# Patient Record
Sex: Female | Born: 1950
Health system: Southern US, Community
[De-identification: ages and names within clinical notes are randomized; demographics above are authoritative.]

## PROBLEM LIST (undated history)

## (undated) DIAGNOSIS — N189 Chronic kidney disease, unspecified: Secondary | ICD-10-CM

## (undated) DIAGNOSIS — F329 Major depressive disorder, single episode, unspecified: Secondary | ICD-10-CM

## (undated) DIAGNOSIS — E119 Type 2 diabetes mellitus without complications: Secondary | ICD-10-CM

## (undated) DIAGNOSIS — F419 Anxiety disorder, unspecified: Secondary | ICD-10-CM

## (undated) DIAGNOSIS — F32A Depression, unspecified: Secondary | ICD-10-CM

## (undated) DIAGNOSIS — E785 Hyperlipidemia, unspecified: Secondary | ICD-10-CM

## (undated) DIAGNOSIS — I10 Essential (primary) hypertension: Secondary | ICD-10-CM

## (undated) DIAGNOSIS — H269 Unspecified cataract: Secondary | ICD-10-CM

## (undated) DIAGNOSIS — K219 Gastro-esophageal reflux disease without esophagitis: Secondary | ICD-10-CM

## (undated) DIAGNOSIS — T7840XA Allergy, unspecified, initial encounter: Secondary | ICD-10-CM

## (undated) DIAGNOSIS — M1990 Unspecified osteoarthritis, unspecified site: Secondary | ICD-10-CM

## (undated) HISTORY — PX: PARTIAL HYSTERECTOMY: SHX80

## (undated) HISTORY — DX: Anxiety disorder, unspecified: F41.9

## (undated) HISTORY — DX: Major depressive disorder, single episode, unspecified: F32.9

## (undated) HISTORY — DX: Chronic kidney disease, unspecified: N18.9

## (undated) HISTORY — PX: KNEE ARTHROSCOPY: SUR90

## (undated) HISTORY — DX: Unspecified cataract: H26.9

## (undated) HISTORY — PX: ESOPHAGEAL DILATION: SHX303

## (undated) HISTORY — PX: SHOULDER ARTHROSCOPY: SHX128

## (undated) HISTORY — DX: Gastro-esophageal reflux disease without esophagitis: K21.9

## (undated) HISTORY — PX: UPPER GASTROINTESTINAL ENDOSCOPY: SHX188

## (undated) HISTORY — DX: Type 2 diabetes mellitus without complications: E11.9

## (undated) HISTORY — PX: POLYPECTOMY: SHX149

## (undated) HISTORY — PX: TUBAL LIGATION: SHX77

## (undated) HISTORY — DX: Allergy, unspecified, initial encounter: T78.40XA

## (undated) HISTORY — PX: HAMMER TOE SURGERY: SHX385

## (undated) HISTORY — DX: Depression, unspecified: F32.A

## (undated) HISTORY — DX: Essential (primary) hypertension: I10

## (undated) HISTORY — PX: TRIGGER FINGER RELEASE: SHX641

## (undated) HISTORY — DX: Unspecified osteoarthritis, unspecified site: M19.90

## (undated) HISTORY — DX: Hyperlipidemia, unspecified: E78.5

---

## 1997-09-16 ENCOUNTER — Ambulatory Visit (HOSPITAL_COMMUNITY): Admission: RE | Admit: 1997-09-16 | Discharge: 1997-09-16 | Payer: Self-pay | Admitting: Internal Medicine

## 2000-12-21 ENCOUNTER — Ambulatory Visit (HOSPITAL_COMMUNITY): Admission: RE | Admit: 2000-12-21 | Discharge: 2000-12-21 | Payer: Self-pay | Admitting: Internal Medicine

## 2000-12-21 ENCOUNTER — Encounter: Payer: Self-pay | Admitting: Internal Medicine

## 2000-12-22 ENCOUNTER — Encounter: Payer: Self-pay | Admitting: Internal Medicine

## 2001-04-24 ENCOUNTER — Other Ambulatory Visit: Admission: RE | Admit: 2001-04-24 | Discharge: 2001-04-24 | Payer: Self-pay | Admitting: Obstetrics and Gynecology

## 2002-07-09 ENCOUNTER — Other Ambulatory Visit: Admission: RE | Admit: 2002-07-09 | Discharge: 2002-07-09 | Payer: Self-pay | Admitting: Obstetrics and Gynecology

## 2004-04-15 ENCOUNTER — Ambulatory Visit: Payer: Self-pay | Admitting: Internal Medicine

## 2004-04-29 ENCOUNTER — Ambulatory Visit: Payer: Self-pay | Admitting: Internal Medicine

## 2004-04-29 HISTORY — PX: COLONOSCOPY: SHX174

## 2004-12-16 ENCOUNTER — Encounter: Admission: RE | Admit: 2004-12-16 | Discharge: 2004-12-16 | Payer: Self-pay | Admitting: Family Medicine

## 2005-05-16 ENCOUNTER — Encounter: Admission: RE | Admit: 2005-05-16 | Discharge: 2005-05-16 | Payer: Self-pay | Admitting: Family Medicine

## 2005-06-23 ENCOUNTER — Encounter: Admission: RE | Admit: 2005-06-23 | Discharge: 2005-06-23 | Payer: Self-pay | Admitting: *Deleted

## 2005-08-07 ENCOUNTER — Emergency Department (HOSPITAL_COMMUNITY): Admission: EM | Admit: 2005-08-07 | Discharge: 2005-08-07 | Payer: Self-pay | Admitting: Emergency Medicine

## 2005-11-12 ENCOUNTER — Observation Stay (HOSPITAL_COMMUNITY): Admission: EM | Admit: 2005-11-12 | Discharge: 2005-11-13 | Payer: Self-pay | Admitting: Emergency Medicine

## 2007-07-22 DIAGNOSIS — I10 Essential (primary) hypertension: Secondary | ICD-10-CM | POA: Insufficient documentation

## 2007-07-22 DIAGNOSIS — R1310 Dysphagia, unspecified: Secondary | ICD-10-CM | POA: Insufficient documentation

## 2007-07-22 DIAGNOSIS — K222 Esophageal obstruction: Secondary | ICD-10-CM

## 2007-07-22 DIAGNOSIS — F411 Generalized anxiety disorder: Secondary | ICD-10-CM | POA: Insufficient documentation

## 2007-07-22 DIAGNOSIS — K219 Gastro-esophageal reflux disease without esophagitis: Secondary | ICD-10-CM

## 2007-07-22 DIAGNOSIS — A048 Other specified bacterial intestinal infections: Secondary | ICD-10-CM | POA: Insufficient documentation

## 2007-07-23 ENCOUNTER — Ambulatory Visit: Payer: Self-pay | Admitting: Internal Medicine

## 2007-07-30 ENCOUNTER — Ambulatory Visit: Payer: Self-pay | Admitting: Internal Medicine

## 2007-07-30 ENCOUNTER — Encounter: Payer: Self-pay | Admitting: Internal Medicine

## 2007-07-30 ENCOUNTER — Telehealth: Payer: Self-pay | Admitting: Internal Medicine

## 2007-07-31 ENCOUNTER — Encounter: Payer: Self-pay | Admitting: Internal Medicine

## 2007-08-08 ENCOUNTER — Encounter: Payer: Self-pay | Admitting: Internal Medicine

## 2008-04-17 ENCOUNTER — Encounter: Payer: Self-pay | Admitting: Internal Medicine

## 2008-10-22 ENCOUNTER — Other Ambulatory Visit: Admission: RE | Admit: 2008-10-22 | Discharge: 2008-10-22 | Payer: Self-pay | Admitting: Family Medicine

## 2008-11-05 ENCOUNTER — Encounter: Admission: RE | Admit: 2008-11-05 | Discharge: 2008-11-05 | Payer: Self-pay | Admitting: Family Medicine

## 2009-02-08 ENCOUNTER — Encounter: Payer: Self-pay | Admitting: Internal Medicine

## 2010-07-08 NOTE — Discharge Summary (Signed)
NAMEJANISA, LABUS              ACCOUNT NO.:  [id number]   MEDICAL RECORD NO.:  [id number]          PATIENT TYPE:  OBV   LOCATION:  4729                         FACILITY:  [facility]   PHYSICIAN:  Corinna L. Lendell Caprice, MDDATE OF BIRTH:  1950-03-20   DATE OF ADMISSION:  11/12/2005  DATE OF DISCHARGE:  11/13/2005                                 DISCHARGE SUMMARY   DISCHARGE DIAGNOSES:  1. Chest pain, myocardial infarction and pulmonary embolus ruled out.  2. Gastroesophageal reflux disease.  3. His hyperlipidemia.  4. Tobacco abuse, counseled against.  5. Previous history of hypertension., blood pressure normal off      antihypertensives.  6. Chronic migraines.   DISCHARGE MEDICATIONS:  1. Prilosec 40 mg a day.  2. She is to STOP Benicar.  3. If her symptoms continue, consider STOPPING Topamax.  4. Continue aspirin 81 mg p.o. daily.   DIET:  Should be low-salt.   ACTIVITY:  Ad lib.   FOLLOWUP:  With Dr. Arvilla Market in 2 weeks.   CONDITION:  Stable.   PROCEDURES:  None.   CONSULTATIONS:  None.   PERTINENT LABS:  CBC unremarkable, D-dimer 0.68.  Complete metabolic panel  unremarkable.  Cardiac enzymes negative.  Special studies in radiology EKG  showed normal sinus rhythm.  Chest x-ray showed nothing acute.  CT angiogram  of the chest showed no PE.  Stress Cardiolite, wet reading, shows no  ischemia and ejection fraction of 84%.   HISTORY AND HOSPITAL COURSE:  Ms. Cerveny is a 60 year old white female  patient of Dr. Cannon Kettle.  Please see H&P for complete admission  details.  She presented with nausea and left-sided chest pain.  She had seen  Dr. Maurice Small in the office and had been scheduled for outpatient  stress test in October.  She returned to the emergency room with a minutes  worth of chest pain.  She had been nauseated for several days.  She had been  on Nexium, in the past, but stopped this on her own.  She also felt some  fluttering of her heart.  She  also felt very panicky before this happened.  She had been started on Benicar and Prozac recently; but stopped both on her  own because she was concerned that they may be contributing to her symptoms.  She had been on antihypertensives but lost about 30 pounds on Topamax; and  since then her blood pressure has decreased.  She has a blood pressure cuff  at home; and off Benicar her blood pressure has been within normal limits.   She had normal vital signs, normal exam, normal EKG, chest x-ray, chest CT;  and was placed on observation to rule out MI.  She remained in normal sinus  rhythm, and had negative serial cardiac enzymes.  Stress Cardiolite was done  and is negative.  I suspect that her pain may be GI related; and I have  given her a prescription for Prilosec.  Her blood pressure has been about  95/50 off Benicar, and I have asked her to stop this.  It is conceivable  that Topamax  can be contributing to some of her symptoms.  If she has no  improvement, I recommended that she call her neurologist to discuss whether  this should be stopped as the other issue is that this could have been panic  attack.  This would not; however, explain five days' worth of nausea.  I  have also given her a prescription for Phenergan as needed 25 mg q.6 h  p.r.n.      Corinna L. Lendell Caprice, MD  Electronically Signed     CLS/MEDQ  D:  11/13/2005  T:  11/15/2005  Job:  [id number]   cc:   Donia Guiles, M.D.

## 2010-07-08 NOTE — Procedures (Signed)
[facility]  Patient:    Autumn Sharp, Autumn Sharp Visit Number: [id number] MRN: [id number]          Service Type: END Location: ENDO Attending Physician:  Mervin Hack Dictated by:   Hedwig Morton. Juanda Chance, M.D. [facility] Admit Date:  12/21/2000   CC:         Desma Maxim, M.D.   Procedure Report  PROCEDURE:  Upper endoscopy with esophageal dilation.  INDICATION:  This 60 year old white female has complained of five months history of difficulty swallowing, mostly with solids.  She has a positive history of H. pylori, which was treated in the past.  She denied any liquid food dysphagia.  She has been on aspirin 325 mg a day.  Prevacid has been increased from 30 mg a day to b.i.d. _____ her last office visit on November 26, 2000.  The patient has been complaining of hoarseness and occasional cough. Upper endoscopy is being done to assess her for esophageal stricture, proceed with dilatation, and recheck her H. pylori test.  ENDOSCOPE:  Olympus single-channel endoscope.  SEDATION:  Versed 7 mg IV, Demerol 50 mg IV.  FINDINGS:  Olympus single-channel endoscope passed under direct vision through the posterior pharynx to the esophagus.  The patient was monitored by pulse oximeter.  Oxygen saturations were normal.  Proximal esophageal mucosa was unremarkable.  The squamocolumnar junction was normal.  There was no stricture.  There was no resistance.  In the distal esophagus, I could not appreciate any hiatal hernia.  Stomach:  Stomach was insufflated with air, revealed normal gastric folds, antrum, and pylorus.  Retroflexion of endoscope revealed normal fundus and cardia.  Duodenum:  Duodenal bulb and descending duodenum were normal.  Endoscope was then brought back through the stomach, a guidewire was placed, endoscope was retracted, and Savary dilator passed under fluoroscopic guidance using 18 mm dilator.  This passed with mild resistance but no blood on  the dilator.  The patient tolerated the procedure well.  IMPRESSION: 1. Passage of 18 mm dilator for solid food dysphagia but no evidence of    stricture. 2. Status post CLOtest.  PLAN: 1. We will observe the patient for the improvement of her symptoms.  There was    no definite stricture found on endoscopy, although the patient had solid    food dysphagia.  She is to continue the Prevacid 30 mg p.o. b.i.d. for two    more weeks, then one p.o. q.d. 2. Decrease aspirin from 325 mg to 81 mg a day. 3. Continue antireflux measures. 4. Await results of the CLOtest. Dictated by:   Hedwig Morton. Juanda Chance, M.D. [facility] Attending Physician:  Mervin Hack DD:  12/21/00 TD:  12/22/00 Job: [id number]

## 2010-07-08 NOTE — H&P (Signed)
Autumn Sharp, Autumn Sharp              ACCOUNT NO.:  [id number]   MEDICAL RECORD NO.:  [id number]          PATIENT TYPE:  OBV   LOCATION:  4729                         FACILITY:  [facility]   PHYSICIAN:  Corinna L. Lendell Caprice, MDDATE OF BIRTH:  December 13, 1950   DATE OF ADMISSION:  11/12/2005  DATE OF DISCHARGE:  11/13/2005                                HISTORY & PHYSICAL   CHIEF COMPLAINT:  Chest pain and nausea.   HISTORY OF PRESENT ILLNESS:  Autumn Sharp is a pleasant 60 year old white  female patient of Dr. Arvilla Market and who has also seen one of the [facility]  cardiologists in the past who presents with chest pain.  It is on the left  side of her chest.  It was accompanied by nausea.  She had palpitations a  few days ago and was seen in the office by Dr. Maurice Small, who  apparently did an EKG and the patient reports it was abnormal.  She  scheduled a stress test with one of the cardiologists later next month, but  the patient had several minutes' worth of chest pain today and therefore  presented to the emergency room.  She has been under a lot of stress  recently and she felt panicky earlier this morning.  She has had problems  with nausea and thought it may be the Benicar which was started recently.  She is also on Topamax and has lost a lot of weight recently.  She also used  to take Nexium for acid reflux but it is not covered by her health plan and  she no longer takes it.  She has no chest pain now.  She has a difficult  time qualifying it further.  She had a few fleeting episodes of left chest  pain yesterday.  She reports having had an echocardiogram and a stress test  about 4 years ago for apparently what sounds like a heart murmur on exam.  She also started Prozac earlier this week but only took 4 days' worth.   PAST MEDICAL HISTORY:  1. Hypertension.  2. Hyperlipidemia.  3. Tobacco abuse.  4. Migraines.   MEDICATIONS:  1. Topamax 100 mg a day.  2. Benicar 40 mg a day, which  she has not taken for 5 days.  3. Aspirin 81 mg a day.  4. Zocor 40 mg a day.  5. She took a few days of Prozac but stopped this a few days ago.   She is allergic to [facility].   SOCIAL HISTORY:  She is married.  She smokes a half a pack of cigarettes a  day.  She denies alcohol abuse or drug abuse.  She works for [employer].   FAMILY HISTORY:  Her father had a heart attack in his early 50s.   REVIEW OF SYSTEMS:  As above, otherwise negative.   PHYSICAL EXAMINATION:  VITAL SIGNS:  Her temperature is 98.4, blood pressure  132/66, pulse 63, respiratory rate 10, oxygen saturation 97% on room air.  GENERAL:  The patient is a somewhat anxious-appearing white female.  HEENT:  Normocephalic, atraumatic.  Pupils equal, round,  reactive to light.  Sclerae nonicteric.  Moist mucous membranes.  NECK:  Supple.  LUNGS:  Clear to auscultation bilaterally without wheezes, rhonchi or rales.  CARDIOVASCULAR:  Regular rate and rhythm without murmurs, gallops or rubs.  There is no chest wall tenderness.  ABDOMEN:  Soft, nontender, nondistended.  GENITOURINARY AND RECTAL:  Deferred.  EXTREMITIES:  No clubbing, cyanosis or edema.  SKIN:  No rash.  PSYCHIATRIC:  Anxious affect.  NEUROLOGIC:  Alert and oriented.  Cranial nerves and sensorimotor exam are  intact.   LABORATORIES:  CBC, BMET, cardiac enzymes negative.  EKG shows normal sinus  rhythm.  Chest x-ray negative.  CT of the chest:  No PE.   ASSESSMENT AND PLAN:  1. Chest pain.  Somewhat atypical, although she does have cardiac risk      factors.  I will place her on observation and discuss with cardiology      getting a stress test in-house.  I will continue her aspirin and hold      her Benicar and Topamax.  2. Nausea.  This sounds suspicious for medication related, possibly the      Topamax.  She seems to think it is the Benicar.  Either way, her blood      pressure is 97/50 and I am going to hold blood pressure medications.       She reports having lost 30 pounds recently and may be able to come off      antihypertensives.  3. Hyperlipidemia.  4. Tobacco abuse, counseled against.  5. Chronic migraines.  See above.      Corinna L. Lendell Caprice, MD  Electronically Signed     CLS/MEDQ  D:  11/12/2005  T:  11/14/2005  Job:  [id number]   cc:   Francisca December, M.D.  Donia Guiles, M.D.

## 2010-12-01 ENCOUNTER — Other Ambulatory Visit: Payer: Self-pay | Admitting: Family Medicine

## 2010-12-01 ENCOUNTER — Other Ambulatory Visit (HOSPITAL_COMMUNITY)
Admission: RE | Admit: 2010-12-01 | Discharge: 2010-12-01 | Disposition: A | Discharge status: Home / Self Care | Payer: 59 | Source: Ambulatory Visit | Attending: Family Medicine | Admitting: Family Medicine

## 2010-12-01 DIAGNOSIS — Z124 Encounter for screening for malignant neoplasm of cervix: Secondary | ICD-10-CM | POA: Insufficient documentation

## 2014-03-28 ENCOUNTER — Encounter: Payer: Self-pay | Admitting: Internal Medicine

## 2014-07-17 ENCOUNTER — Encounter: Payer: Self-pay | Admitting: Internal Medicine

## 2014-07-21 ENCOUNTER — Other Ambulatory Visit: Payer: Self-pay | Admitting: Family Medicine

## 2014-07-21 ENCOUNTER — Other Ambulatory Visit (HOSPITAL_COMMUNITY)
Admission: RE | Admit: 2014-07-21 | Discharge: 2014-07-21 | Disposition: A | Discharge status: Home / Self Care | Payer: 59 | Source: Ambulatory Visit | Attending: Family Medicine | Admitting: Family Medicine

## 2014-07-21 DIAGNOSIS — Z124 Encounter for screening for malignant neoplasm of cervix: Secondary | ICD-10-CM | POA: Insufficient documentation

## 2014-07-22 LAB — CYTOLOGY - PAP

## 2014-08-28 ENCOUNTER — Encounter: Payer: Self-pay | Admitting: Internal Medicine

## 2014-09-25 ENCOUNTER — Encounter: Payer: Self-pay | Admitting: Gastroenterology

## 2014-10-16 ENCOUNTER — Encounter: Payer: Self-pay | Admitting: Internal Medicine

## 2014-11-24 ENCOUNTER — Ambulatory Visit (AMBULATORY_SURGERY_CENTER): Payer: Self-pay

## 2014-11-24 VITALS — Ht 64.0 in | Wt 175.8 lb

## 2014-11-24 DIAGNOSIS — Z800 Family history of malignant neoplasm of digestive organs: Secondary | ICD-10-CM

## 2014-11-24 MED ORDER — NA SULFATE-K SULFATE-MG SULF 17.5-3.13-1.6 GM/177ML PO SOLN: ORAL | Status: DC

## 2014-11-24 NOTE — Progress Notes (Signed)
Per pt, no allergies to soy or egg products.Pt not taking any weight loss meds or using  O2 at home. 

## 2014-11-30 ENCOUNTER — Telehealth: Payer: Self-pay | Admitting: Gastroenterology

## 2014-11-30 MED ORDER — NA SULFATE-K SULFATE-MG SULF 17.5-3.13-1.6 GM/177ML PO SOLN: 1.0000 | Freq: Once | ORAL | Status: DC

## 2014-11-30 NOTE — Telephone Encounter (Signed)
Autumn Sharp placed phone call to patient. Prep 95.00 without insurance and 85.00 with insurance. Too expensive for patient. Sample placed at front desk for patient to pick up.

## 2014-11-30 NOTE — Telephone Encounter (Signed)
Advised pt we can give her a sample and she can pick it up at the desk on the 4th floor.

## 2014-12-04 ENCOUNTER — Telehealth: Payer: Self-pay | Admitting: Gastroenterology

## 2014-12-04 ENCOUNTER — Encounter: Payer: Self-pay | Admitting: *Deleted

## 2014-12-04 NOTE — Telephone Encounter (Signed)
Spoke with patient and she is having back pain that is radiating down her left leg. She wants to reschedule her procedure. Moved her to 02/01/15. New instructions mailed to patient.

## 2014-12-08 ENCOUNTER — Encounter: Payer: Self-pay | Admitting: Gastroenterology

## 2015-01-28 ENCOUNTER — Telehealth: Payer: Self-pay | Admitting: Gastroenterology

## 2015-01-28 NOTE — Telephone Encounter (Signed)
No that's ok, she called with advance notice.   Autumn Sharp, can you please try to fill this slot with one of the patient's on our waiting list, or if the PAs have an urgent case to get done? Thanks

## 2015-01-28 NOTE — Telephone Encounter (Signed)
Yes, would you like me to also fill the slots on the 14th as well?

## 2015-01-28 NOTE — Telephone Encounter (Signed)
Amber can you please discuss my schedule with Remo Lipps who know the details. I will be in touch with Remo Lipps further about this tomorrow. Thanks

## 2015-02-01 ENCOUNTER — Encounter: Payer: Self-pay | Admitting: Gastroenterology

## 2015-03-22 ENCOUNTER — Ambulatory Visit (AMBULATORY_SURGERY_CENTER): Payer: Self-pay | Admitting: *Deleted

## 2015-03-22 VITALS — Ht 64.0 in | Wt 174.0 lb

## 2015-03-22 DIAGNOSIS — Z1211 Encounter for screening for malignant neoplasm of colon: Secondary | ICD-10-CM

## 2015-03-22 NOTE — Progress Notes (Signed)
No egg or soy allergy known to patient  No issues with past sedation with any surgeries  or procedures, no intubation problems  No diet pills per patient No home 02 use per patient  No blood thinners per patient  Pt has a suprep from a PV 11-2014 so another not ordered  emmi declined

## 2015-04-05 ENCOUNTER — Encounter: Payer: Self-pay | Admitting: Gastroenterology

## 2015-04-05 ENCOUNTER — Ambulatory Visit (AMBULATORY_SURGERY_CENTER): Payer: 59 | Admitting: Gastroenterology

## 2015-04-05 VITALS — BP 124/68 | HR 69 | Temp 97.7°F | Resp 20 | Ht 64.0 in | Wt 174.0 lb

## 2015-04-05 DIAGNOSIS — D123 Benign neoplasm of transverse colon: Secondary | ICD-10-CM

## 2015-04-05 DIAGNOSIS — D120 Benign neoplasm of cecum: Secondary | ICD-10-CM | POA: Diagnosis not present

## 2015-04-05 DIAGNOSIS — D122 Benign neoplasm of ascending colon: Secondary | ICD-10-CM

## 2015-04-05 DIAGNOSIS — Z1211 Encounter for screening for malignant neoplasm of colon: Secondary | ICD-10-CM

## 2015-04-05 MED ORDER — SODIUM CHLORIDE 0.9 % IV SOLN: 500.0000 mL | INTRAVENOUS | Status: DC

## 2015-04-05 NOTE — Op Note (Signed)
[place]  [address] [place], [address]   COLONOSCOPY PROCEDURE REPORT  PATIENT: Autumn, Sharp  MR#: [id number] BIRTHDATE: [birth year] , 55  yrs. old GENDER: female ENDOSCOPIST: Yetta Flock, MD REFERRED BY: Kelton Pillar MD PROCEDURE DATE:  04/05/2015 PROCEDURE:   Colonoscopy, screening, Colonoscopy with snare polypectomy, and Colonoscopy with biopsy First Screening Colonoscopy - Avg.  risk and is 50 yrs.  old or older - No.  Prior Negative Screening - Now for repeat screening. N/A  History of Adenoma - Now for follow-up colonoscopy & has been > or = to 3 yrs.  N/A  Polyps removed today? Yes ASA CLASS:   Class II INDICATIONS:Screening for colonic neoplasia and Colorectal Neoplasm Risk Assessment for this procedure is average risk. MEDICATIONS: Propofol 550 mg IV  DESCRIPTION OF PROCEDURE:   After the risks benefits and alternatives of the procedure were thoroughly explained, informed consent was obtained.  The digital rectal exam revealed no abnormalities of the rectum.   The LB [id number]-[id number]  endoscope was introduced through the anus and advanced to the cecum, which was identified by both the appendix and ileocecal valve. No adverse events experienced.   The quality of the prep was adequate  The instrument was then slowly withdrawn as the colon was fully examined. Estimated blood loss is zero unless otherwise noted in this procedure report.      COLON FINDINGS: A diminutive flat polyp was noted in the cecum and removed with cold forceps.  Two flat polyps roughly 5-50mm in size were noted in the ascending colon and removed via cold snare.  Five flat polyps were noted in the transverse colon, roughly 4-7 mm in size and removed with cold snare.  One sessile polyps roughly 4-29mm was noted in the transverse colon and removed with cold snare.  A 66mm sessile polyp was noted in the transverse colon and removed with cold forceps.  Mild to  moderate diverticulosis was noted in the left colon.  Of note, a few of these transverse polyps were removed during intubation.  Several of the flat polyps removed on this exam were difficult to grasp with the soft snare, but had difficulty cutting through with cold technique when grasped with the stiff snare, leading to prolonged procedure time.  However, all polyps were removed successfully without the use of cautery. Retroflexed views revealed internal hemorrhoids. The time to cecum = 14.2 Withdrawal time = 31.4   The scope was withdrawn and the procedure completed. COMPLICATIONS: There were no immediate complications.  ENDOSCOPIC IMPRESSION: 10 polyps removed as outlined above Diverticulosis Internal hemorrhoids  RECOMMENDATIONS: Await pathology results No NSAIDS for 2 weeks Resume diet Resume medications  eSigned:  Yetta Flock, MD 04/05/2015 4:13 PM   cc:  Kelton Pillar MD, the patient   PATIENT NAME:  Autumn Sharp, Autumn Sharp MR#: [id number]

## 2015-04-05 NOTE — Progress Notes (Signed)
  [facility] Post-op Note  Patient: Autumn Sharp  Procedure(s) Performed: colonoscopy  Patient Location: [facility] - Recovery Area  Anesthesia Type: Deep Sedation/Propofol  Level of Consciousness: awake, oriented and patient cooperative  Airway and Oxygen Therapy: Patient Spontanous Breathing  Post-op Pain: none  Post-op Assessment:  Post-op Vital signs reviewed, Patient's Cardiovascular Status Stable, Respiratory Function Stable, Patent Airway, No signs of Nausea or vomiting and Pain level controlled  Post-op Vital Signs: Reviewed and stable  Complications: No apparent anesthesia complications  Ruthella Kirchman E 4:12 PM

## 2015-04-05 NOTE — Patient Instructions (Signed)
YOU HAD AN ENDOSCOPIC PROCEDURE TODAY AT [facility]:   Refer to the procedure report that was given to you for any specific questions about what was found during the examination.  If the procedure report does not answer your questions, please call your gastroenterologist to clarify.  If you requested that your care partner not be given the details of your procedure findings, then the procedure report has been included in a sealed envelope for you to review at your convenience later.  YOU SHOULD EXPECT: Some feelings of bloating in the abdomen. Passage of more gas than usual.  Walking can help get rid of the air that was put into your GI tract during the procedure and reduce the bloating. If you had a lower endoscopy (such as a colonoscopy or flexible sigmoidoscopy) you may notice spotting of blood in your stool or on the toilet paper. If you underwent a bowel prep for your procedure, you may not have a normal bowel movement for a few days.  Please Note:  You might notice some irritation and congestion in your nose or some drainage.  This is from the oxygen used during your procedure.  There is no need for concern and it should clear up in a day or so.  SYMPTOMS TO REPORT IMMEDIATELY:   Following lower endoscopy (colonoscopy or flexible sigmoidoscopy):  Excessive amounts of blood in the stool  Significant tenderness or worsening of abdominal pains  Swelling of the abdomen that is new, acute  Fever of 100F or higher   For urgent or emergent issues, a gastroenterologist can be reached at any hour by calling [phone].   DIET: Your first meal following the procedure should be a small meal and then it is ok to progress to your normal diet. Heavy or fried foods are harder to digest and may make you feel nauseous or bloated.  Likewise, meals heavy in dairy and vegetables can increase bloating.  Drink plenty of fluids but you should avoid alcoholic beverages for 24  hours.  ACTIVITY:  You should plan to take it easy for the rest of today and you should NOT DRIVE or use heavy machinery until tomorrow (because of the sedation medicines used during the test).    FOLLOW UP: Our staff will call the number listed on your records the next business day following your procedure to check on you and address any questions or concerns that you may have regarding the information given to you following your procedure. If we do not reach you, we will leave a message.  However, if you are feeling well and you are not experiencing any problems, there is no need to return our call.  We will assume that you have returned to your regular daily activities without incident.  If any biopsies were taken you will be contacted by phone or by letter within the next 1-3 weeks.  Please call us at [phone] if you have not heard about the biopsies in 3 weeks.    SIGNATURES/CONFIDENTIALITY: You and/or your care partner have signed paperwork which will be entered into your electronic medical record.  These signatures attest to the fact that that the information above on your After Visit Summary has been reviewed and is understood.  Full responsibility of the confidentiality of this discharge information lies with you and/or your care-partner.  Polyps, diverticulosis, hemorrhoids-handouts given  No NSAIDs (ibuprofen, motrin, advil, aleve, aspirin, etc) for 2 weeks.  Repeat colonoscopy will be determined by pathology.

## 2015-04-05 NOTE — Progress Notes (Signed)
Called to room to assist during endoscopic procedure.  Patient ID and intended procedure confirmed with present staff. Received instructions for my participation in the procedure from the performing physician.  

## 2015-04-06 ENCOUNTER — Telehealth: Payer: Self-pay

## 2015-04-06 NOTE — Telephone Encounter (Signed)
  Follow up Call-  Call back number 04/05/2015  Post procedure Call Back phone  # [phone] cell  Permission to leave phone message Yes     Patient questions:  Do you have a fever, pain , or abdominal swelling? No. Pain Score  0 *  Have you tolerated food without any problems? Yes.    Have you been able to return to your normal activities? Yes.    Do you have any questions about your discharge instructions: Diet   No. Medications  No. Follow up visit  No.  Do you have questions or concerns about your Care? No.  Actions: * If pain score is 4 or above: No action needed, pain <4.

## 2015-04-09 ENCOUNTER — Encounter: Payer: Self-pay | Admitting: Gastroenterology

## 2015-05-03 ENCOUNTER — Encounter: Payer: Self-pay | Admitting: Internal Medicine

## 2016-03-06 ENCOUNTER — Encounter: Payer: Self-pay | Admitting: Gastroenterology

## 2016-04-19 ENCOUNTER — Encounter: Payer: Self-pay | Admitting: Gastroenterology

## 2016-05-26 ENCOUNTER — Ambulatory Visit (AMBULATORY_SURGERY_CENTER): Payer: Self-pay

## 2016-05-26 ENCOUNTER — Encounter: Payer: Self-pay | Admitting: Gastroenterology

## 2016-05-26 VITALS — Ht 63.75 in | Wt 172.4 lb

## 2016-05-26 DIAGNOSIS — Z860100 Personal history of colon polyps, unspecified: Secondary | ICD-10-CM

## 2016-05-26 MED ORDER — SUPREP BOWEL PREP KIT 17.5-3.13-1.6 GM/177ML PO SOLN: 1.0000 | Freq: Once | ORAL | 0 refills | Status: AC

## 2016-05-26 NOTE — Progress Notes (Signed)
No allergies to eggs or soy No diet meds nohome oxygen No past problems with anesthesia except did have issues with Ether many years ago  Registered for emmi

## 2016-06-09 ENCOUNTER — Ambulatory Visit (AMBULATORY_SURGERY_CENTER): Payer: 59 | Admitting: Gastroenterology

## 2016-06-09 ENCOUNTER — Encounter: Payer: Self-pay | Admitting: Gastroenterology

## 2016-06-09 VITALS — BP 93/66 | HR 60 | Temp 98.0°F | Resp 17 | Ht 63.0 in | Wt 172.0 lb

## 2016-06-09 DIAGNOSIS — D123 Benign neoplasm of transverse colon: Secondary | ICD-10-CM

## 2016-06-09 DIAGNOSIS — Z86010 Personal history of colonic polyps: Secondary | ICD-10-CM

## 2016-06-09 DIAGNOSIS — D120 Benign neoplasm of cecum: Secondary | ICD-10-CM | POA: Diagnosis not present

## 2016-06-09 MED ORDER — SODIUM CHLORIDE 0.9 % IV SOLN: 500.0000 mL | INTRAVENOUS | Status: DC

## 2016-06-09 NOTE — Progress Notes (Signed)
o recovery, report to [employer], Therapist, sports, VSS

## 2016-06-09 NOTE — Progress Notes (Signed)
Called to room to assist during endoscopic procedure.  Patient ID and intended procedure confirmed with present staff. Received instructions for my participation in the procedure from the performing physician.  

## 2016-06-09 NOTE — Op Note (Signed)
[place] Patient Name: Autumn Sharp Procedure Date: 06/09/2016 2:36 PM MRN: [id number] Endoscopist: Remo Lipps P. Bonne Whack MD, MD Age: 66 Referring MD:  Date of Birth: 1950/03/31 Gender: Female Account #: [id number] Procedure:                Colonoscopy Indications:              Surveillance: History of numerous (10) adenomas on                            last colonoscopy 1 year ago Medicines:                Monitored Anesthesia Care Procedure:                Pre-Anesthesia Assessment:                           - Prior to the procedure, a History and Physical                            was performed, and patient medications and                            allergies were reviewed. The patient's tolerance of                            previous anesthesia was also reviewed. The risks                            and benefits of the procedure and the sedation                            options and risks were discussed with the patient.                            All questions were answered, and informed consent                            was obtained. Prior Anticoagulants: The patient has                            taken no previous anticoagulant or antiplatelet                            agents. ASA Grade Assessment: II - A patient with                            mild systemic disease. After reviewing the risks                            and benefits, the patient was deemed in                            satisfactory condition to undergo the procedure.  After obtaining informed consent, the colonoscope                            was passed under direct vision. Throughout the                            procedure, the patient's blood pressure, pulse, and                            oxygen saturations were monitored continuously. The                            Colonoscope was introduced through the anus and                            advanced to the the  cecum, identified by                            appendiceal orifice and ileocecal valve. The                            colonoscopy was performed without difficulty. The                            patient tolerated the procedure well. The quality                            of the bowel preparation was adequate. The                            ileocecal valve, appendiceal orifice, and rectum                            were photographed. Scope In: 0:26:37 PM Scope Out: 3:09:10 PM Scope Withdrawal Time: 0 hours 18 minutes 43 seconds  Total Procedure Duration: 0 hours 22 minutes 51 seconds  Findings:                 The perianal and digital rectal examinations were                            normal.                           Two flat polyps were found in the cecum. The polyps                            were 3 to 6 mm in size. These polyps were removed                            with a cold snare. Resection and retrieval were                            complete.  A 5 mm polyp was found in the transverse colon. The                            polyp was flat. The polyp was removed with a cold                            snare. Resection and retrieval were complete.                           A few medium-mouthed diverticula were found in the                            sigmoid colon.                           The colon was tortuous.                           Internal hemorrhoids were found during retroflexion.                           The exam was otherwise without abnormality. Complications:            No immediate complications. Estimated blood loss:                            Minimal. Estimated Blood Loss:     Estimated blood loss was minimal. Impression:               - Two 3 to 6 mm polyps in the cecum, removed with a                            cold snare. Resected and retrieved.                           - One 5 mm polyp in the transverse colon, removed                             with a cold snare. Resected and retrieved.                           - Diverticulosis in the sigmoid colon.                           - Tortuous colon.                           - Internal hemorrhoids.                           - The examination was otherwise normal. Recommendation:           - Patient has a contact number available for                            emergencies. The signs and symptoms  of potential                            delayed complications were discussed with the                            patient. Return to normal activities tomorrow.                            Written discharge instructions were provided to the                            patient.                           - Resume previous diet.                           - Continue present medications.                           - Await pathology results.                           - Repeat colonoscopy is recommended for                            surveillance in 3 years                           - No ibuprofen, naproxen, or other non-steroidal                            anti-inflammatory drugs for 2 weeks after polyp                            removal. Remo Lipps P. Braydyn Schultes MD, MD 06/09/2016 3:13:16 PM This report has been signed electronically.

## 2016-06-09 NOTE — Progress Notes (Signed)
Pt's states no medical or surgical changes since previsit or office visit. 

## 2016-06-09 NOTE — Patient Instructions (Signed)
YOU HAD AN ENDOSCOPIC PROCEDURE TODAY AT [facility]:   Refer to the procedure report that was given to you for any specific questions about what was found during the examination.  If the procedure report does not answer your questions, please call your gastroenterologist to clarify.  If you requested that your care partner not be given the details of your procedure findings, then the procedure report has been included in a sealed envelope for you to review at your convenience later.  YOU SHOULD EXPECT: Some feelings of bloating in the abdomen. Passage of more gas than usual.  Walking can help get rid of the air that was put into your GI tract during the procedure and reduce the bloating. If you had a lower endoscopy (such as a colonoscopy or flexible sigmoidoscopy) you may notice spotting of blood in your stool or on the toilet paper. If you underwent a bowel prep for your procedure, you may not have a normal bowel movement for a few days.  Please Note:  You might notice some irritation and congestion in your nose or some drainage.  This is from the oxygen used during your procedure.  There is no need for concern and it should clear up in a day or so.  SYMPTOMS TO REPORT IMMEDIATELY:   Following lower endoscopy (colonoscopy or flexible sigmoidoscopy):  Excessive amounts of blood in the stool  Significant tenderness or worsening of abdominal pains  Swelling of the abdomen that is new, acute  Fever of 100F or higher   For urgent or emergent issues, a gastroenterologist can be reached at any hour by calling [phone].   DIET:  We do recommend a small meal at first, but then you may proceed to your regular diet.  Drink plenty of fluids but you should avoid alcoholic beverages for 24 hours.  ACTIVITY:  You should plan to take it easy for the rest of today and you should NOT DRIVE or use heavy machinery until tomorrow (because of the sedation medicines used during the test).     FOLLOW UP: Our staff will call the number listed on your records the next business day following your procedure to check on you and address any questions or concerns that you may have regarding the information given to you following your procedure. If we do not reach you, we will leave a message.  However, if you are feeling well and you are not experiencing any problems, there is no need to return our call.  We will assume that you have returned to your regular daily activities without incident.  If any biopsies were taken you will be contacted by phone or by letter within the next 1-3 weeks.  Please call us at [phone] if you have not heard about the biopsies in 3 weeks.    SIGNATURES/CONFIDENTIALITY: You and/or your care partner have signed paperwork which will be entered into your electronic medical record.  These signatures attest to the fact that that the information above on your After Visit Summary has been reviewed and is understood.  Full responsibility of the confidentiality of this discharge information lies with you and/or your care-partner.  Polyp, diverticulosis, high fiber diet information.given.  Hemorrhoid information given.

## 2016-06-12 ENCOUNTER — Telehealth: Payer: Self-pay | Admitting: *Deleted

## 2016-06-12 NOTE — Telephone Encounter (Signed)
  Follow up Call-  Call back number 06/09/2016 04/05/2015  Post procedure Call Back phone  # [phone] cell  Permission to leave phone message Yes Yes  Some recent data might be hidden     Patient questions:  Do you have a fever, pain , or abdominal swelling? No. Pain Score  0 *  Have you tolerated food without any problems? Yes.    Have you been able to return to your normal activities? Yes.    Do you have any questions about your discharge instructions: Diet   No. Medications  No. Follow up visit  No.  Do you have questions or concerns about your Care? No.  Actions: * If pain score is 4 or above: No action needed, pain <4.

## 2016-06-13 ENCOUNTER — Encounter: Payer: Self-pay | Admitting: Gastroenterology

## 2016-08-29 ENCOUNTER — Other Ambulatory Visit: Payer: Self-pay | Admitting: Family Medicine

## 2016-08-29 ENCOUNTER — Ambulatory Visit
Admission: RE | Admit: 2016-08-29 | Discharge: 2016-08-29 | Disposition: A | Discharge status: Home / Self Care | Payer: 59 | Source: Ambulatory Visit | Attending: Family Medicine | Admitting: Family Medicine

## 2016-08-29 DIAGNOSIS — R05 Cough: Secondary | ICD-10-CM

## 2016-08-29 DIAGNOSIS — R059 Cough, unspecified: Secondary | ICD-10-CM

## 2018-10-11 ENCOUNTER — Emergency Department: Payer: 59

## 2018-10-11 ENCOUNTER — Encounter: Payer: Self-pay | Admitting: Emergency Medicine

## 2018-10-11 ENCOUNTER — Other Ambulatory Visit: Payer: Self-pay

## 2018-10-11 ENCOUNTER — Emergency Department
Admission: EM | Admit: 2018-10-11 | Discharge: 2018-10-11 | Disposition: A | Discharge status: Home / Self Care | Payer: 59 | Attending: Emergency Medicine | Admitting: Emergency Medicine

## 2018-10-11 DIAGNOSIS — Z79899 Other long term (current) drug therapy: Secondary | ICD-10-CM | POA: Diagnosis not present

## 2018-10-11 DIAGNOSIS — N23 Unspecified renal colic: Secondary | ICD-10-CM

## 2018-10-11 DIAGNOSIS — R1031 Right lower quadrant pain: Secondary | ICD-10-CM | POA: Diagnosis present

## 2018-10-11 DIAGNOSIS — N132 Hydronephrosis with renal and ureteral calculous obstruction: Secondary | ICD-10-CM | POA: Diagnosis not present

## 2018-10-11 DIAGNOSIS — I10 Essential (primary) hypertension: Secondary | ICD-10-CM | POA: Insufficient documentation

## 2018-10-11 LAB — LIPASE, BLOOD: Lipase: 72 U/L — ABNORMAL HIGH (ref 11–51)

## 2018-10-11 LAB — URINALYSIS, COMPLETE (UACMP) WITH MICROSCOPIC
Bacteria, UA: NONE SEEN
Bilirubin Urine: NEGATIVE
Glucose, UA: NEGATIVE mg/dL
Ketones, ur: NEGATIVE mg/dL
Leukocytes,Ua: NEGATIVE
Nitrite: NEGATIVE
Protein, ur: NEGATIVE mg/dL
Specific Gravity, Urine: 1.021 (ref 1.005–1.030)
pH: 6 (ref 5.0–8.0)

## 2018-10-11 LAB — COMPREHENSIVE METABOLIC PANEL
ALT: 21 U/L (ref 0–44)
AST: 21 U/L (ref 15–41)
Albumin: 4.4 g/dL (ref 3.5–5.0)
Alkaline Phosphatase: 57 U/L (ref 38–126)
Anion gap: 7 (ref 5–15)
BUN: 13 mg/dL (ref 8–23)
CO2: 28 mmol/L (ref 22–32)
Calcium: 9.8 mg/dL (ref 8.9–10.3)
Chloride: 102 mmol/L (ref 98–111)
Creatinine, Ser: 0.98 mg/dL (ref 0.44–1.00)
GFR calc Af Amer: >60 mL/min (ref >60)
GFR calc non Af Amer: >60 mL/min (ref >60)
Glucose, Bld: 131 mg/dL — ABNORMAL HIGH (ref 70–99)
Potassium: 3.5 mmol/L (ref 3.5–5.1)
Sodium: 137 mmol/L (ref 135–145)
Total Bilirubin: 0.7 mg/dL (ref 0.3–1.2)
Total Protein: 7.4 g/dL (ref 6.5–8.1)

## 2018-10-11 LAB — CBC
HCT: 42.9 % (ref 36.0–46.0)
Hemoglobin: 13.9 g/dL (ref 12.0–15.0)
MCH: 30.6 pg (ref 26.0–34.0)
MCHC: 32.4 g/dL (ref 30.0–36.0)
MCV: 94.5 fL (ref 80.0–100.0)
Platelets: 283 10*3/uL (ref 150–400)
RBC: 4.54 MIL/uL (ref 3.87–5.11)
RDW: 12.5 % (ref 11.5–15.5)
WBC: 12.5 10*3/uL — ABNORMAL HIGH (ref 4.0–10.5)
nRBC: 0 % (ref 0.0–0.2)

## 2018-10-11 MED ORDER — IOHEXOL 240 MG/ML SOLN
50.0000 mL | Freq: Once | INTRAMUSCULAR | Status: AC
  Administered 2018-10-11: 50 mL via ORAL

## 2018-10-11 MED ORDER — MORPHINE SULFATE (PF) 4 MG/ML IV SOLN
4.0000 mg | Freq: Once | INTRAVENOUS | Status: AC
  Administered 2018-10-11: 15:00:00 4 mg via INTRAVENOUS
  Filled 2018-10-11: qty 1

## 2018-10-11 MED ORDER — IOHEXOL 300 MG/ML  SOLN
100.0000 mL | Freq: Once | INTRAMUSCULAR | Status: AC | PRN
  Administered 2018-10-11: 17:00:00 100 mL via INTRAVENOUS

## 2018-10-11 MED ORDER — KETOROLAC TROMETHAMINE 30 MG/ML IJ SOLN
INTRAMUSCULAR | Status: AC
  Administered 2018-10-11: 18:00:00 30 mg via INTRAVENOUS
  Filled 2018-10-11: qty 1

## 2018-10-11 MED ORDER — FENTANYL CITRATE (PF) 100 MCG/2ML IJ SOLN
50.0000 ug | INTRAMUSCULAR | Status: DC | PRN
  Administered 2018-10-11: 50 ug via INTRAVENOUS
  Filled 2018-10-11: qty 2

## 2018-10-11 MED ORDER — OXYCODONE-ACETAMINOPHEN 5-325 MG PO TABS: 1.0000 | ORAL_TABLET | ORAL | 0 refills | Status: AC | PRN

## 2018-10-11 MED ORDER — TAMSULOSIN HCL 0.4 MG PO CAPS: 0.4000 mg | ORAL_CAPSULE | Freq: Every day | ORAL | 0 refills | Status: AC

## 2018-10-11 MED ORDER — ONDANSETRON HCL 4 MG/2ML IJ SOLN
4.0000 mg | Freq: Once | INTRAMUSCULAR | Status: AC | PRN
  Administered 2018-10-11: 4 mg via INTRAVENOUS
  Filled 2018-10-11: qty 2

## 2018-10-11 MED ORDER — ONDANSETRON HCL 4 MG/2ML IJ SOLN
4.0000 mg | Freq: Once | INTRAMUSCULAR | Status: AC
  Administered 2018-10-11: 16:00:00 4 mg via INTRAVENOUS
  Filled 2018-10-11: qty 2

## 2018-10-11 MED ORDER — KETOROLAC TROMETHAMINE 30 MG/ML IJ SOLN
30.0000 mg | Freq: Once | INTRAMUSCULAR | Status: AC
  Administered 2018-10-11: 18:00:00 30 mg via INTRAVENOUS

## 2018-10-11 MED ORDER — SODIUM CHLORIDE 0.9 % IV BOLUS
1000.0000 mL | Freq: Once | INTRAVENOUS | Status: AC
  Administered 2018-10-11: 17:00:00 1000 mL via INTRAVENOUS

## 2018-10-11 NOTE — ED Triage Notes (Signed)
PT arrives with complaints of RLQ pain that started at 1100 today. PT increased with palpation. Pt also report nausea and vomiting.

## 2018-10-11 NOTE — Discharge Instructions (Addendum)
You have a kidney stone.  It small enough that it should pass.  To help it pass will have you take the Flomax 1 pill a day.  Sometimes it will make you little woozy please be careful.  I also can give you some Percocet for pain.  Take 1 pill 4 times a day as needed for pain.  Please strain all your urine and try and catch the stone.  Take the stone to the urologist.  Dr. Bernardo Heater is on-call he is very good.  Please give his office a call Monday morning let him know that she had a 4 mm kidney stone he should be out of follow-up with you in about a week or so.  Please return here for worse pain fever vomiting or feeling sicker.

## 2018-10-11 NOTE — ED Notes (Signed)
Patient transported to CT 

## 2018-10-11 NOTE — ED Provider Notes (Signed)
[facility] Emergency Department Provider Note   ____________________________________________   First MD Initiated Contact with Patient 10/11/18 1514     (approximate)  I have reviewed the triage vital signs and the nursing notes.   HISTORY  Chief Complaint Abdominal Pain   HPI Autumn Sharp is a 68 y.o. female who reports right lower quadrant starting at 11:00 today.  Pain is worse with palpation and percussion.  Patient also had some nausea and vomiting.  She reports a couple days ago she had some blood in her urine but no pain.  Now she has the pain but no blood in the urine and no dysuria.  Pain is moderate.  Helped a lot by the morphine and Zofran in the waiting room.  Is kind of deep and achy pain.  Constant.         Past Medical History:  Diagnosis Date  . Allergy    seasonal  . Anxiety   . Depression   . GERD (gastroesophageal reflux disease)   . Hyperlipidemia   . Hypertension     Patient Active Problem List   Diagnosis Date Noted  . HELICOBACTER PYLORI INFECTION 07/22/2007  . ANXIETY 07/22/2007  . HYPERTENSION 07/22/2007  . ESOPHAGEAL STRICTURE 07/22/2007  . GERD 07/22/2007  . DYSPHAGIA UNSPECIFIED 07/22/2007    Past Surgical History:  Procedure Laterality Date  . COLONOSCOPY  04-29-2004   DB-normal  . ESOPHAGEAL DILATION    . HAMMER TOE SURGERY     bil little toes  . KNEE ARTHROSCOPY     left knee  . PARTIAL HYSTERECTOMY    . SHOULDER ARTHROSCOPY     right  . TRIGGER FINGER RELEASE     right hand  . TUBAL LIGATION    . UPPER GASTROINTESTINAL ENDOSCOPY      Prior to Admission medications   Medication Sig Start Date End Date Taking? Authorizing Provider  aspirin 81 MG tablet Take 81 mg by mouth daily.    [provider]  calcium-vitamin D (OSCAL WITH D) 500-200 MG-UNIT tablet Take 1 tablet by mouth daily with breakfast.    [provider]  FLUoxetine (PROZAC) 20 MG capsule Take 20 mg by mouth  daily.    [provider]  fluticasone (FLONASE) 50 MCG/ACT nasal spray Place 2 sprays into both nostrils daily. Reported on 04/05/2015    [provider]  loratadine (ALLERGY RELIEF) 10 MG tablet Take 10 mg by mouth daily.    [provider]  losartan-hydrochlorothiazide (HYZAAR) 100-12.5 MG tablet Take 1 tablet by mouth daily.    [provider]  oxyCODONE-acetaminophen (PERCOCET) 5-325 MG tablet Take 1 tablet by mouth every 4 (four) hours as needed for severe pain. 10/11/18 10/11/19  Nena Polio, MD  ranitidine (ZANTAC) 150 MG capsule Take 150 mg by mouth daily.    [provider]  simvastatin (ZOCOR) 40 MG tablet Take 40 mg by mouth daily.    [provider]  tamsulosin (FLOMAX) 0.4 MG CAPS capsule Take 1 capsule (0.4 mg total) by mouth daily. 10/11/18   Nena Polio, MD  TURMERIC PO Take 500 mg by mouth daily. Reported on 03/22/2015    [provider]  vitamin B-12 (CYANOCOBALAMIN) 100 MCG tablet Take 100 mcg by mouth daily.    [provider]    Allergies Atenolol and Shellfish allergy  Family History  Problem Relation Age of Onset  . Colon cancer Maternal Aunt   . Liver disease Brother   .  Colon polyps Neg Hx   . Esophageal cancer Neg Hx   . Rectal cancer Neg Hx   . Stomach cancer Neg Hx   . Pancreatic cancer Neg Hx     Social History Social History   Tobacco Use  . Smoking status: Never Smoker  . Smokeless tobacco: Never Used  Substance Use Topics  . Alcohol use: No    Alcohol/week: 0.0 standard drinks  . Drug use: No    Review of Systems  Constitutional: No fever/chills Eyes: No visual changes. ENT: No sore throat. Cardiovascular: Denies chest pain. Respiratory: Denies shortness of breath. Gastrointestinal: Right lower quadrant abdominal pain.   nausea,  vomiting.  No diarrhea.  No constipation. Genitourinary: Negative for dysuria. Musculoskeletal: Negative for back pain. Skin: Negative  for rash. Neurological: Negative for headaches, focal weakness   ____________________________________________   PHYSICAL EXAM:  VITAL SIGNS: ED Triage Vitals  Enc Vitals Group     BP 10/11/18 1337 137/62     Pulse Rate 10/11/18 1337 (!) 59     Resp 10/11/18 1337 20     Temp 10/11/18 1337 98.5 F (36.9 C)     Temp Source 10/11/18 1337 Oral     SpO2 10/11/18 1337 96 %     Weight 10/11/18 1335 156 lb (70.8 kg)     Height 10/11/18 1335 5\' 4"  (1.626 m)     Head Circumference --      Peak Flow --      Pain Score 10/11/18 1336 8     Pain Loc --      Pain Edu? --      Excl. in [place]? --     Constitutional: Alert and oriented. Well appearing and in no acute distress. Eyes: Conjunctivae are normal.  Head: Atraumatic. Nose: No congestion/rhinnorhea. Mouth/Throat: Mucous membranes are moist.  Oropharynx non-erythematous. Neck: No stridor.   Cardiovascular: Normal rate, regular rhythm. Grossly normal heart sounds.  Good peripheral circulation. Respiratory: Normal respiratory effort.  No retractions. Lungs CTAB. Gastrointestinal: Soft and nontender in the right lower quadrant where is tender to palpation and slightly to percussion. No distention. No abdominal bruits. No CVA tenderness. Musculoskeletal: No lower extremity tenderness nor edema.   Neurologic:  Normal speech and language. No gross focal neurologic deficits are appreciated.  Skin:  Skin is warm, dry and intact. No rash noted.   ____________________________________________   LABS (all labs ordered are listed, but only abnormal results are displayed)  Labs Reviewed  LIPASE, BLOOD - Abnormal; Notable for the following components:      Result Value   Lipase 72 (*)    All other components within normal limits  COMPREHENSIVE METABOLIC PANEL - Abnormal; Notable for the following components:   Glucose, Bld 131 (*)    All other components within normal limits  CBC - Abnormal; Notable for the following components:   WBC 12.5  (*)    All other components within normal limits  URINALYSIS, COMPLETE (UACMP) WITH MICROSCOPIC - Abnormal; Notable for the following components:   Color, Urine STRAW (*)    APPearance CLEAR (*)    Hgb urine dipstick LARGE (*)    All other components within normal limits   ____________________________________________  EKG   ____________________________________________  RADIOLOGY  ED MD interpretation:   Official radiology report(s): Ct Abdomen Pelvis W Contrast  Result Date: 10/11/2018 CLINICAL DATA:  RIGHT lower quadrant pain.  Nausea and vomiting EXAM: CT ABDOMEN AND PELVIS WITH CONTRAST TECHNIQUE: Multidetector CT imaging of the abdomen  and pelvis was performed using the standard protocol following bolus administration of intravenous contrast. CONTRAST:  162mL OMNIPAQUE IOHEXOL 300 MG/ML  SOLN COMPARISON:  None. FINDINGS: Lower chest: Lung bases are clear. Hepatobiliary: No focal hepatic lesion. No biliary duct dilatation. Gallbladder is normal. Common bile duct is normal. Pancreas: Pancreas is normal. No ductal dilatation. No pancreatic inflammation. Spleen: Normal spleen Adrenals/urinary tract: Adrenal glands are normal. Hydronephrosis and hydroureter of the RIGHT collecting system secondary to obstructing calculus in the distal RIGHT ureter measuring 4 mm on image 81/2. This distal RIGHT ureteral calculus is approximately 1 cm from the vesicoureteral junction. There is inflammation of the distal ureter associated obstructing stone. No LEFT nephrolithiasis or ureterolithiasis. No bladder calculi. Nonenhancing cyst exophytic from the LEFT kidney is small at 1 cm. Stomach/Bowel: Stomach, small bowel, appendix, and cecum are normal. The colon and rectosigmoid colon are normal. Vascular/Lymphatic: Abdominal aorta is normal caliber. No periportal or retroperitoneal adenopathy. No pelvic adenopathy. Reproductive: Post hysterectomy Other: No free fluid. Musculoskeletal: No aggressive osseous  lesion. IMPRESSION: 1. High-grade obstruction of the RIGHT ureter with a calculus 1 cm from the RIGHT vascular junction. 2. Minimal excretion of contrast in the collecting system on the RIGHT on delayed imaging. Electronically Signed   By: Suzy Bouchard M.D.   On: 10/11/2018 17:16    ____________________________________________   PROCEDURES  Procedure(s) performed (including Critical Care):  Procedures   ____________________________________________   INITIAL IMPRESSION / ASSESSMENT AND PLAN / ED COURSE  DESHANDRA KOELLING was evaluated in Emergency Department on 10/11/2018 for the symptoms described in the history of present illness. She was evaluated in the context of the global COVID-19 pandemic, which necessitated consideration that the patient might be at risk for infection with the SARS-CoV-2 virus that causes COVID-19. Institutional protocols and algorithms that pertain to the evaluation of patients at risk for COVID-19 are in a state of rapid change based on information released by regulatory bodies including the CDC and federal and state organizations. These policies and algorithms were followed during the patient's care in the ED.    Patient with renal colic I will give her some Flomax and Percocet she will follow-up with Dr. Priscella Mann off return for any further problems.      Clinical Course as of Oct 11 1930  Fri Oct 11, 2018  1515 MCV: 94.5 [PM]    Clinical Course User Index [PM] Nena Polio, MD     ____________________________________________   FINAL CLINICAL IMPRESSION(S) / ED DIAGNOSES  Final diagnoses:  Renal colic     ED Discharge Orders         Ordered    oxyCODONE-acetaminophen (PERCOCET) 5-325 MG tablet  Every 4 hours PRN     10/11/18 1840    tamsulosin (FLOMAX) 0.4 MG CAPS capsule  Daily     10/11/18 1840           Note:  This document was prepared using Dragon voice recognition software and may include unintentional dictation errors.     Nena Polio, MD 10/11/18 [phone]

## 2019-04-08 DIAGNOSIS — R69 Illness, unspecified: Secondary | ICD-10-CM | POA: Diagnosis not present

## 2019-04-16 DIAGNOSIS — R69 Illness, unspecified: Secondary | ICD-10-CM | POA: Diagnosis not present

## 2019-05-28 DIAGNOSIS — R7303 Prediabetes: Secondary | ICD-10-CM | POA: Diagnosis not present

## 2019-05-28 DIAGNOSIS — E782 Mixed hyperlipidemia: Secondary | ICD-10-CM | POA: Diagnosis not present

## 2019-05-28 DIAGNOSIS — Z7189 Other specified counseling: Secondary | ICD-10-CM | POA: Diagnosis not present

## 2019-05-28 DIAGNOSIS — I10 Essential (primary) hypertension: Secondary | ICD-10-CM | POA: Diagnosis not present

## 2019-06-17 DIAGNOSIS — R69 Illness, unspecified: Secondary | ICD-10-CM | POA: Diagnosis not present

## 2019-07-11 DIAGNOSIS — R69 Illness, unspecified: Secondary | ICD-10-CM | POA: Diagnosis not present

## 2019-07-29 ENCOUNTER — Encounter: Payer: Self-pay | Admitting: Gastroenterology

## 2019-07-31 DIAGNOSIS — Z1231 Encounter for screening mammogram for malignant neoplasm of breast: Secondary | ICD-10-CM | POA: Diagnosis not present

## 2019-08-03 DIAGNOSIS — R69 Illness, unspecified: Secondary | ICD-10-CM | POA: Diagnosis not present

## 2019-08-04 DIAGNOSIS — R69 Illness, unspecified: Secondary | ICD-10-CM | POA: Diagnosis not present

## 2019-08-14 DIAGNOSIS — E119 Type 2 diabetes mellitus without complications: Secondary | ICD-10-CM | POA: Diagnosis not present

## 2019-08-21 HISTORY — PX: COLONOSCOPY W/ POLYPECTOMY: SHX1380

## 2019-08-27 DIAGNOSIS — R69 Illness, unspecified: Secondary | ICD-10-CM | POA: Diagnosis not present

## 2019-09-04 ENCOUNTER — Ambulatory Visit (AMBULATORY_SURGERY_CENTER): Payer: Self-pay | Admitting: *Deleted

## 2019-09-04 ENCOUNTER — Other Ambulatory Visit: Payer: Self-pay

## 2019-09-04 VITALS — Ht 64.0 in | Wt 161.0 lb

## 2019-09-04 DIAGNOSIS — Z01818 Encounter for other preprocedural examination: Secondary | ICD-10-CM

## 2019-09-04 DIAGNOSIS — Z86010 Personal history of colonic polyps: Secondary | ICD-10-CM

## 2019-09-04 MED ORDER — SUTAB 1479-225-188 MG PO TABS: 24.0000 | ORAL_TABLET | ORAL | 0 refills | Status: DC

## 2019-09-04 NOTE — Progress Notes (Signed)
No egg or soy allergy known to patient  No issues with past sedation with any surgeries or procedures no intubation problems in the past  No diet pills per patient No home 02 use per patient  No blood thinners per patient  Pt denies issues with constipation  No A fib or A flutter  EMMI video to pt or MyChart  COVID 19 guidelines implemented in [facility] today   sutab  Coupon given to pt in [facility] today   Due to the COVID-19 pandemic we are asking patients to follow these guidelines. Please only bring one care partner. Please be aware that your care partner may wait in the car in the parking lot or if they feel like they will be too hot to wait in the car, they may wait in the lobby on the 4th floor. All care partners are required to wear a mask the entire time (we do not have any that we can provide them), they need to practice social distancing, and we will do a Covid check for all patient's and care partners when you arrive. Also we will check their temperature and your temperature. If the care partner waits in their car they need to stay in the parking lot the entire time and we will call them on their cell phone when the patient is ready for discharge so they can bring the car to the front of the building. Also all patient's will need to wear a mask into building.s[place]ab

## 2019-09-05 ENCOUNTER — Encounter: Payer: Self-pay | Admitting: Gastroenterology

## 2019-09-16 ENCOUNTER — Other Ambulatory Visit: Payer: Self-pay

## 2019-09-16 ENCOUNTER — Other Ambulatory Visit
Admission: RE | Admit: 2019-09-16 | Discharge: 2019-09-16 | Disposition: A | Discharge status: Home / Self Care | Payer: Medicare HMO | Source: Ambulatory Visit | Attending: Gastroenterology | Admitting: Gastroenterology

## 2019-09-16 DIAGNOSIS — Z01812 Encounter for preprocedural laboratory examination: Secondary | ICD-10-CM | POA: Insufficient documentation

## 2019-09-16 DIAGNOSIS — Z20822 Contact with and (suspected) exposure to covid-19: Secondary | ICD-10-CM | POA: Insufficient documentation

## 2019-09-16 LAB — SARS CORONAVIRUS 2 (TAT 6-24 HRS): SARS Coronavirus 2: NEGATIVE

## 2019-09-18 ENCOUNTER — Other Ambulatory Visit: Payer: Self-pay

## 2019-09-18 ENCOUNTER — Ambulatory Visit (AMBULATORY_SURGERY_CENTER): Payer: Medicare HMO | Admitting: Gastroenterology

## 2019-09-18 ENCOUNTER — Encounter: Payer: Self-pay | Admitting: Gastroenterology

## 2019-09-18 VITALS — BP 108/66 | HR 56 | Temp 97.1°F | Resp 16 | Ht 64.0 in | Wt 161.0 lb

## 2019-09-18 DIAGNOSIS — D120 Benign neoplasm of cecum: Secondary | ICD-10-CM

## 2019-09-18 DIAGNOSIS — D123 Benign neoplasm of transverse colon: Secondary | ICD-10-CM

## 2019-09-18 DIAGNOSIS — Z86010 Personal history of colonic polyps: Secondary | ICD-10-CM | POA: Diagnosis not present

## 2019-09-18 DIAGNOSIS — Z1211 Encounter for screening for malignant neoplasm of colon: Secondary | ICD-10-CM | POA: Diagnosis not present

## 2019-09-18 MED ORDER — SODIUM CHLORIDE 0.9 % IV SOLN: 500.0000 mL | Freq: Once | INTRAVENOUS | Status: DC

## 2019-09-18 NOTE — Op Note (Signed)
Autumn Sharp: Autumn Sharp Procedure Date: 09/18/2019 8:32 AM MRN: [id number] Endoscopist: Remo Lipps P. Havery Moros , MD Age: 69 Referring MD:  Date of Birth: 01-17-1951 Gender: Female Account #: [id number] Procedure:                Colonoscopy Indications:              High risk colon cancer surveillance: Personal                            history of colonic polyps (2017 - 10 adenomas, 2008                            - 3 polyps) Medicines:                Monitored Anesthesia Care Procedure:                Pre-Anesthesia Assessment:                           - Prior to the procedure, a History and Physical                            was performed, and patient medications and                            allergies were reviewed. The patient's tolerance of                            previous anesthesia was also reviewed. The risks                            and benefits of the procedure and the sedation                            options and risks were discussed with the patient.                            All questions were answered, and informed consent                            was obtained. Prior Anticoagulants: The patient has                            taken no previous anticoagulant or antiplatelet                            agents. ASA Grade Assessment: II - A patient with                            mild systemic disease. After reviewing the risks                            and benefits, the patient was deemed in  satisfactory condition to undergo the procedure.                           After obtaining informed consent, the colonoscope                            was passed under direct vision. Throughout the                            procedure, the patient's blood pressure, pulse, and                            oxygen saturations were monitored continuously. The                            Colonoscope was introduced through the anus  and                            advanced to the the cecum, identified by                            appendiceal orifice and ileocecal valve. The                            colonoscopy was performed without difficulty. The                            patient tolerated the procedure well. The quality                            of the bowel preparation was good. The ileocecal                            valve, appendiceal orifice, and rectum were                            photographed. Scope In: 8:36:49 AM Scope Out: 9:00:16 AM Scope Withdrawal Time: 0 hours 18 minutes 40 seconds  Total Procedure Duration: 0 hours 23 minutes 27 seconds  Findings:                 The perianal and digital rectal examinations were                            normal.                           Two sessile polyps were found in the cecum. The                            polyps were 3 mm in size. These polyps were removed                            with a cold snare. Resection and retrieval were  complete.                           A diminutive polyp was found in the hepatic                            flexure. The polyp was sessile. The polyp was                            removed with a cold snare. Resection and retrieval                            were complete.                           A 4 mm polyp was found in the transverse colon. The                            polyp was sessile. The polyp was removed with a                            cold snare. Resection and retrieval were complete.                           A few small-mouthed diverticula were found in the                            sigmoid colon.                           Internal hemorrhoids were found during                            retroflexion. The hemorrhoids were small.                           The exam was otherwise without abnormality. Complications:            No immediate complications. Estimated blood loss:                             Minimal. Estimated Blood Loss:     Estimated blood loss was minimal. Impression:               - Two 3 mm polyps in the cecum, removed with a cold                            snare. Resected and retrieved.                           - One diminutive polyp at the hepatic flexure,                            removed with a cold snare. Resected and retrieved.                           -  One 4 mm polyp in the transverse colon, removed                            with a cold snare. Resected and retrieved.                           - Diverticulosis in the sigmoid colon.                           - Internal hemorrhoids.                           - The examination was otherwise normal. Recommendation:           - Patient has a contact number available for                            emergencies. The signs and symptoms of potential                            delayed complications were discussed with the                            patient. Return to normal activities tomorrow.                            Written discharge instructions were provided to the                            patient.                           - Resume previous diet.                           - Continue present medications.                           - Await pathology results. Remo Lipps P. Faith Branan, MD 09/18/2019 9:05:01 AM This report has been signed electronically.

## 2019-09-18 NOTE — Patient Instructions (Signed)
HANDOUTS PROVIDED ON: POLYPS, DIVERTICULOSIS, & HEMORRHOIDS  The polyps removed today have been sent for pathology.  The results can take 1-3 weeks to receive.  When your next colonoscopy should occur will be based on the pathology results.    You may resume your previous diet and medication schedule.  Thank you for allowing us to care for you today!!!   YOU HAD AN ENDOSCOPIC PROCEDURE TODAY AT THE [facility]:   Refer to the procedure report that was given to you for any specific questions about what was found during the examination.  If the procedure report does not answer your questions, please call your gastroenterologist to clarify.  If you requested that your care partner not be given the details of your procedure findings, then the procedure report has been included in a sealed envelope for you to review at your convenience later.  YOU SHOULD EXPECT: Some feelings of bloating in the abdomen. Passage of more gas than usual.  Walking can help get rid of the air that was put into your GI tract during the procedure and reduce the bloating. If you had a lower endoscopy (such as a colonoscopy or flexible sigmoidoscopy) you may notice spotting of blood in your stool or on the toilet paper. If you underwent a bowel prep for your procedure, you may not have a normal bowel movement for a few days.  Please Note:  You might notice some irritation and congestion in your nose or some drainage.  This is from the oxygen used during your procedure.  There is no need for concern and it should clear up in a day or so.  SYMPTOMS TO REPORT IMMEDIATELY:   Following lower endoscopy (colonoscopy or flexible sigmoidoscopy):  Excessive amounts of blood in the stool  Significant tenderness or worsening of abdominal pains  Swelling of the abdomen that is new, acute  Fever of 100F or higher  For urgent or emergent issues, a gastroenterologist can be reached at any hour by calling [phone]. Do  not use MyChart messaging for urgent concerns.    DIET:  We do recommend a small meal at first, but then you may proceed to your regular diet.  Drink plenty of fluids but you should avoid alcoholic beverages for 24 hours.  ACTIVITY:  You should plan to take it easy for the rest of today and you should NOT DRIVE or use heavy machinery until tomorrow (because of the sedation medicines used during the test).    FOLLOW UP: Our staff will call the number listed on your records 48-72 hours following your procedure to check on you and address any questions or concerns that you may have regarding the information given to you following your procedure. If we do not reach you, we will leave a message.  We will attempt to reach you two times.  During this call, we will ask if you have developed any symptoms of COVID 19. If you develop any symptoms (ie: fever, flu-like symptoms, shortness of breath, cough etc.) before then, please call [phone].  If you test positive for Covid 19 in the 2 weeks post procedure, please call and report this information to us.    If any biopsies were taken you will be contacted by phone or by letter within the next 1-3 weeks.  Please call us at [phone] if you have not heard about the biopsies in 3 weeks.    SIGNATURES/CONFIDENTIALITY: You and/or your care partner have signed paperwork which will   be entered into your electronic medical record.  These signatures attest to the fact that that the information above on your After Visit Summary has been reviewed and is understood.  Full responsibility of the confidentiality of this discharge information lies with you and/or your care-partner. 

## 2019-09-18 NOTE — Progress Notes (Signed)
Pt's states no medical or surgical changes since previsit or office visit. 

## 2019-09-18 NOTE — Progress Notes (Signed)
Called to room to assist during endoscopic procedure.  Patient ID and intended procedure confirmed with present staff. Received instructions for my participation in the procedure from the performing physician.  

## 2019-09-22 ENCOUNTER — Telehealth: Payer: Self-pay | Admitting: *Deleted

## 2019-09-22 DIAGNOSIS — R69 Illness, unspecified: Secondary | ICD-10-CM | POA: Diagnosis not present

## 2019-09-22 NOTE — Telephone Encounter (Signed)
Message left

## 2019-09-22 NOTE — Telephone Encounter (Signed)
Attempted f/u phone call. No answer. Left message. °

## 2019-10-16 DIAGNOSIS — R69 Illness, unspecified: Secondary | ICD-10-CM | POA: Diagnosis not present

## 2019-10-28 DIAGNOSIS — H269 Unspecified cataract: Secondary | ICD-10-CM | POA: Diagnosis not present

## 2019-10-28 DIAGNOSIS — R69 Illness, unspecified: Secondary | ICD-10-CM | POA: Diagnosis not present

## 2019-10-28 DIAGNOSIS — J309 Allergic rhinitis, unspecified: Secondary | ICD-10-CM | POA: Diagnosis not present

## 2019-10-28 DIAGNOSIS — Z008 Encounter for other general examination: Secondary | ICD-10-CM | POA: Diagnosis not present

## 2019-10-28 DIAGNOSIS — R32 Unspecified urinary incontinence: Secondary | ICD-10-CM | POA: Diagnosis not present

## 2019-10-28 DIAGNOSIS — E785 Hyperlipidemia, unspecified: Secondary | ICD-10-CM | POA: Diagnosis not present

## 2019-10-28 DIAGNOSIS — Z809 Family history of malignant neoplasm, unspecified: Secondary | ICD-10-CM | POA: Diagnosis not present

## 2019-10-28 DIAGNOSIS — I10 Essential (primary) hypertension: Secondary | ICD-10-CM | POA: Diagnosis not present

## 2019-10-28 DIAGNOSIS — F329 Major depressive disorder, single episode, unspecified: Secondary | ICD-10-CM | POA: Diagnosis not present

## 2019-10-28 DIAGNOSIS — Z7982 Long term (current) use of aspirin: Secondary | ICD-10-CM | POA: Diagnosis not present

## 2019-10-28 DIAGNOSIS — F419 Anxiety disorder, unspecified: Secondary | ICD-10-CM | POA: Diagnosis not present

## 2019-11-08 DIAGNOSIS — R69 Illness, unspecified: Secondary | ICD-10-CM | POA: Diagnosis not present

## 2019-12-02 DIAGNOSIS — R69 Illness, unspecified: Secondary | ICD-10-CM | POA: Diagnosis not present

## 2019-12-04 DIAGNOSIS — R69 Illness, unspecified: Secondary | ICD-10-CM | POA: Diagnosis not present

## 2019-12-31 DIAGNOSIS — R69 Illness, unspecified: Secondary | ICD-10-CM | POA: Diagnosis not present

## 2019-12-31 DIAGNOSIS — J309 Allergic rhinitis, unspecified: Secondary | ICD-10-CM | POA: Diagnosis not present

## 2019-12-31 DIAGNOSIS — M8588 Other specified disorders of bone density and structure, other site: Secondary | ICD-10-CM | POA: Diagnosis not present

## 2019-12-31 DIAGNOSIS — Z1389 Encounter for screening for other disorder: Secondary | ICD-10-CM | POA: Diagnosis not present

## 2019-12-31 DIAGNOSIS — Z0000 Encounter for general adult medical examination without abnormal findings: Secondary | ICD-10-CM | POA: Diagnosis not present

## 2019-12-31 DIAGNOSIS — R7303 Prediabetes: Secondary | ICD-10-CM | POA: Diagnosis not present

## 2019-12-31 DIAGNOSIS — I10 Essential (primary) hypertension: Secondary | ICD-10-CM | POA: Diagnosis not present

## 2019-12-31 DIAGNOSIS — K219 Gastro-esophageal reflux disease without esophagitis: Secondary | ICD-10-CM | POA: Diagnosis not present

## 2019-12-31 DIAGNOSIS — E782 Mixed hyperlipidemia: Secondary | ICD-10-CM | POA: Diagnosis not present

## 2019-12-31 DIAGNOSIS — I8390 Asymptomatic varicose veins of unspecified lower extremity: Secondary | ICD-10-CM | POA: Diagnosis not present

## 2020-06-29 DIAGNOSIS — R7303 Prediabetes: Secondary | ICD-10-CM | POA: Diagnosis not present

## 2020-06-29 DIAGNOSIS — R079 Chest pain, unspecified: Secondary | ICD-10-CM | POA: Diagnosis not present

## 2020-06-29 DIAGNOSIS — E782 Mixed hyperlipidemia: Secondary | ICD-10-CM | POA: Diagnosis not present

## 2020-06-29 DIAGNOSIS — I10 Essential (primary) hypertension: Secondary | ICD-10-CM | POA: Diagnosis not present

## 2020-07-22 DIAGNOSIS — E119 Type 2 diabetes mellitus without complications: Secondary | ICD-10-CM | POA: Diagnosis not present

## 2020-07-22 DIAGNOSIS — H2513 Age-related nuclear cataract, bilateral: Secondary | ICD-10-CM | POA: Diagnosis not present

## 2020-08-25 DIAGNOSIS — H2512 Age-related nuclear cataract, left eye: Secondary | ICD-10-CM | POA: Diagnosis not present

## 2020-08-26 ENCOUNTER — Encounter: Payer: Self-pay | Admitting: Ophthalmology

## 2020-08-26 ENCOUNTER — Other Ambulatory Visit: Payer: Self-pay

## 2020-09-07 NOTE — Discharge Instructions (Signed)

## 2020-09-08 ENCOUNTER — Encounter
Admission: RE | Disposition: A | Discharge status: Home / Self Care | Payer: Self-pay | Source: Home / Self Care | Attending: Ophthalmology

## 2020-09-08 ENCOUNTER — Ambulatory Visit: Payer: Medicare HMO | Admitting: Anesthesiology

## 2020-09-08 ENCOUNTER — Other Ambulatory Visit: Payer: Self-pay

## 2020-09-08 ENCOUNTER — Ambulatory Visit
Admission: RE | Admit: 2020-09-08 | Discharge: 2020-09-08 | Disposition: A | Discharge status: Home / Self Care | Payer: Medicare HMO | Attending: Ophthalmology | Admitting: Ophthalmology

## 2020-09-08 DIAGNOSIS — Z91013 Allergy to seafood: Secondary | ICD-10-CM | POA: Insufficient documentation

## 2020-09-08 DIAGNOSIS — N189 Chronic kidney disease, unspecified: Secondary | ICD-10-CM | POA: Diagnosis not present

## 2020-09-08 DIAGNOSIS — Z888 Allergy status to other drugs, medicaments and biological substances status: Secondary | ICD-10-CM | POA: Diagnosis not present

## 2020-09-08 DIAGNOSIS — H25812 Combined forms of age-related cataract, left eye: Secondary | ICD-10-CM | POA: Diagnosis not present

## 2020-09-08 DIAGNOSIS — R7303 Prediabetes: Secondary | ICD-10-CM | POA: Diagnosis not present

## 2020-09-08 DIAGNOSIS — Z79899 Other long term (current) drug therapy: Secondary | ICD-10-CM | POA: Insufficient documentation

## 2020-09-08 DIAGNOSIS — I129 Hypertensive chronic kidney disease with stage 1 through stage 4 chronic kidney disease, or unspecified chronic kidney disease: Secondary | ICD-10-CM | POA: Insufficient documentation

## 2020-09-08 DIAGNOSIS — Z7982 Long term (current) use of aspirin: Secondary | ICD-10-CM | POA: Insufficient documentation

## 2020-09-08 DIAGNOSIS — H2512 Age-related nuclear cataract, left eye: Secondary | ICD-10-CM | POA: Diagnosis not present

## 2020-09-08 HISTORY — PX: CATARACT EXTRACTION W/PHACO: SHX586

## 2020-09-08 SURGERY — PHACOEMULSIFICATION, CATARACT, WITH IOL INSERTION
Anesthesia: Monitor Anesthesia Care | Site: Eye | Laterality: Left

## 2020-09-08 MED ORDER — CEFUROXIME OPHTHALMIC INJECTION 1 MG/0.1 ML
INJECTION | OPHTHALMIC | Status: DC | PRN
  Administered 2020-09-08: 0.1 mL via INTRACAMERAL

## 2020-09-08 MED ORDER — SIGHTPATH DOSE#1 BSS IO SOLN
INTRAOCULAR | Status: DC | PRN
  Administered 2020-09-08: 74 mL via OPHTHALMIC

## 2020-09-08 MED ORDER — SIGHTPATH DOSE#1 NA HYALUR & NA CHOND-NA HYALUR IO KIT
PACK | INTRAOCULAR | Status: DC | PRN
  Administered 2020-09-08: 1 via OPHTHALMIC

## 2020-09-08 MED ORDER — BRIMONIDINE TARTRATE-TIMOLOL 0.2-0.5 % OP SOLN
OPHTHALMIC | Status: DC | PRN
  Administered 2020-09-08: 1 [drp] via OPHTHALMIC

## 2020-09-08 MED ORDER — PHENYLEPHRINE HCL 10 % OP SOLN
1.0000 [drp] | OPHTHALMIC | Status: AC
  Administered 2020-09-08 (×3): 1 [drp] via OPHTHALMIC

## 2020-09-08 MED ORDER — LACTATED RINGERS IV SOLN: INTRAVENOUS | Status: DC

## 2020-09-08 MED ORDER — SIGHTPATH DOSE#1 BSS IO SOLN
INTRAOCULAR | Status: DC | PRN
  Administered 2020-09-08: 1 mL

## 2020-09-08 MED ORDER — TETRACAINE HCL 0.5 % OP SOLN
1.0000 [drp] | OPHTHALMIC | Status: DC | PRN
  Administered 2020-09-08 (×3): 1 [drp] via OPHTHALMIC

## 2020-09-08 MED ORDER — FENTANYL CITRATE (PF) 100 MCG/2ML IJ SOLN
INTRAMUSCULAR | Status: DC | PRN
  Administered 2020-09-08: 50 ug via INTRAVENOUS

## 2020-09-08 MED ORDER — CYCLOPENTOLATE HCL 2 % OP SOLN
1.0000 [drp] | OPHTHALMIC | Status: AC
  Administered 2020-09-08 (×3): 1 [drp] via OPHTHALMIC

## 2020-09-08 MED ORDER — MIDAZOLAM HCL 2 MG/2ML IJ SOLN
INTRAMUSCULAR | Status: DC | PRN
  Administered 2020-09-08: 1 mg via INTRAVENOUS

## 2020-09-08 SURGICAL SUPPLY — 25 items
CANNULA ANT/CHMB 27G (MISCELLANEOUS) ×1 IMPLANT
CANNULA ANT/CHMB 27GA (MISCELLANEOUS) ×2 IMPLANT
GLOVE SURG ENC TEXT LTX SZ7.5 (GLOVE) ×2 IMPLANT
GLOVE SURG GAMMEX PI TX LF 7.5 (GLOVE) ×1 IMPLANT
GLOVE SURG TRIUMPH 8.0 PF LTX (GLOVE) ×2 IMPLANT
GOWN STRL REUS W/ TWL LRG LVL3 (GOWN DISPOSABLE) ×2 IMPLANT
GOWN STRL REUS W/TWL LRG LVL3 (GOWN DISPOSABLE) ×4
LENS IOL TECNIS EYHANCE 22.5 (Intraocular Lens) ×1 IMPLANT
MARKER SKIN DUAL TIP RULER LAB (MISCELLANEOUS) ×2 IMPLANT
NDL CAPSULORHEX 25GA (NEEDLE) ×1 IMPLANT
NDL FILTER BLUNT 18X1 1/2 (NEEDLE) ×2 IMPLANT
NDL RETROBULBAR .5 NSTRL (NEEDLE) IMPLANT
NEEDLE CAPSULORHEX 25GA (NEEDLE) ×2 IMPLANT
NEEDLE FILTER BLUNT 18X 1/2SAF (NEEDLE) ×2
NEEDLE FILTER BLUNT 18X1 1/2 (NEEDLE) ×2 IMPLANT
PACK EYE AFTER SURG (MISCELLANEOUS) ×2 IMPLANT
RING MALYGIN 7.0 (MISCELLANEOUS) IMPLANT
SUT ETHILON 10-0 CS-B-6CS-B-6 (SUTURE)
SUT VICRYL  9 0 (SUTURE)
SUT VICRYL 9 0 (SUTURE) IMPLANT
SUTURE EHLN 10-0 CS-B-6CS-B-6 (SUTURE) IMPLANT
SYR 3ML LL SCALE MARK (SYRINGE) ×4 IMPLANT
SYR TB 1ML LUER SLIP (SYRINGE) ×2 IMPLANT
WATER STERILE IRR 250ML POUR (IV SOLUTION) ×2 IMPLANT
WIPE NON LINTING 3.25X3.25 (MISCELLANEOUS) ×2 IMPLANT

## 2020-09-08 NOTE — Op Note (Signed)
  OPERATIVE NOTE  Autumn Sharp [id number] 09/08/2020   PREOPERATIVE DIAGNOSIS:  Nuclear sclerotic cataract left eye. H25.12   POSTOPERATIVE DIAGNOSIS:    Nuclear sclerotic cataract left eye.     PROCEDURE:  Phacoemusification with posterior chamber intraocular lens placement of the left eye  Ultrasound time: Procedure(s): CATARACT EXTRACTION PHACO AND INTRAOCULAR LENS PLACEMENT (IOC) LEFT DIABETIC 10.18 01:27.3 (Left)  LENS:   Implant Name Type Inv. Item Serial No. Manufacturer Lot No. LRB No. Used Action  LENS IOL TECNIS EYHANCE 22.5 - [id number] Intraocular Lens LENS IOL TECNIS EYHANCE 22.5 [id number] JOHNSON   Left 1 Implanted      SURGEON:  Wyonia Hough, MD   ANESTHESIA:  Topical with tetracaine drops and 2% Xylocaine jelly, augmented with 1% preservative-free intracameral lidocaine.    COMPLICATIONS:  None.   DESCRIPTION OF PROCEDURE:  The patient was identified in the holding room and transported to the operating room and placed in the supine position under the operating microscope.  The left eye was identified as the operative eye and it was prepped and draped in the usual sterile ophthalmic fashion.   A 1 millimeter clear-corneal paracentesis was made at the 1:30 position.  0.5 ml of preservative-free 1% lidocaine was injected into the anterior chamber.  The anterior chamber was filled with Viscoat viscoelastic.  A 2.4 millimeter keratome was used to make a near-clear corneal incision at the 10:30 position.  .  A curvilinear capsulorrhexis was made with a cystotome and capsulorrhexis forceps.  Balanced salt solution was used to hydrodissect and hydrodelineate the nucleus.   Phacoemulsification was then used in stop and chop fashion to remove the lens nucleus and epinucleus.  The remaining cortex was then removed using the irrigation and aspiration handpiece. Provisc was then placed into the capsular bag to distend it for lens placement.  A lens was then injected  into the capsular bag.  The remaining viscoelastic was aspirated.   Wounds were hydrated with balanced salt solution.  The anterior chamber was inflated to a physiologic pressure with balanced salt solution.  No wound leaks were noted. Cefuroxime 0.1 ml of a 10mg /ml solution was injected into the anterior chamber for a dose of 1 mg of intracameral antibiotic at the completion of the case.   Timolol and Brimonidine drops were applied to the eye.  The patient was taken to the recovery room in stable condition without complications of anesthesia or surgery.  [name] 09/08/2020, 11:04 AM

## 2020-09-08 NOTE — Anesthesia Preprocedure Evaluation (Signed)
Anesthesia Evaluation  Patient identified by MRN, date of birth, ID band Patient awake    History of Anesthesia Complications Negative for: history of anesthetic complications  Airway Mallampati: II  TM Distance: >3 FB Neck ROM: Full    Dental no notable dental hx.    Pulmonary neg pulmonary ROS,    Pulmonary exam normal        Cardiovascular hypertension, Normal cardiovascular exam     Neuro/Psych negative neurological ROS     GI/Hepatic negative GI ROS, Neg liver ROS,   Endo/Other  diabetes (pre-DM)  Renal/GU      Musculoskeletal   Abdominal   Peds  Hematology negative hematology ROS (+)   Anesthesia Other Findings   Reproductive/Obstetrics                             Anesthesia Physical Anesthesia Plan  ASA: 2  Anesthesia Plan: MAC   Post-op Pain Management:    Induction: Intravenous  PONV Risk Score and Plan: 2 and TIVA, Midazolam and Treatment may vary due to age or medical condition  Airway Management Planned: Nasal Cannula and Natural Airway  Additional Equipment: None  Intra-op Plan:   Post-operative Plan:   Informed Consent: I have reviewed the patients History and Physical, chart, labs and discussed the procedure including the risks, benefits and alternatives for the proposed anesthesia with the patient or authorized representative who has indicated his/her understanding and acceptance.       Plan Discussed with: CRNA  Anesthesia Plan Comments:         Anesthesia Quick Evaluation

## 2020-09-08 NOTE — H&P (Signed)
[facility]   Primary Care Physician:  Kelton Pillar, MD Ophthalmologist: Dr. Leandrew Koyanagi  Pre-Procedure History & Physical: HPI:  Autumn Sharp is a 70 y.o. female here for ophthalmic surgery.   Past Medical History:  Diagnosis Date   Allergy    seasonal   Anxiety    Arthritis    ? in back    Cataract    forming    Chronic kidney disease    kidney stones 09-2018   Depression    GERD (gastroesophageal reflux disease)    Hyperlipidemia    Hypertension     Past Surgical History:  Procedure Laterality Date   COLONOSCOPY  04-29-2004   DB-normal   ESOPHAGEAL DILATION     HAMMER TOE SURGERY     bil little toes   KNEE ARTHROSCOPY     left knee   PARTIAL HYSTERECTOMY     POLYPECTOMY     SHOULDER ARTHROSCOPY     right   TRIGGER FINGER RELEASE     right hand   TUBAL LIGATION     UPPER GASTROINTESTINAL ENDOSCOPY      Prior to Admission medications   Medication Sig Start Date End Date Taking? Authorizing Provider  aspirin 81 MG tablet Take 81 mg by mouth daily.   Yes [provider]  BLACK ELDERBERRY PO Take by mouth. gummies- 1 am, 1 pm   Yes [provider]  calcium-vitamin D (OSCAL WITH D) 500-200 MG-UNIT tablet Take 1 tablet by mouth daily with breakfast.   Yes [provider]  FLUoxetine (PROZAC) 40 MG capsule Take 40 mg by mouth daily. 07/23/19  Yes [provider]  fluticasone (FLONASE) 50 MCG/ACT nasal spray Place 2 sprays into both nostrils daily. Reported on 04/05/2015   Yes [provider]  losartan-hydrochlorothiazide (HYZAAR) 100-12.5 MG tablet Take 1 tablet by mouth daily.   Yes [provider]  Multiple Vitamin (MULTIVITAMIN) tablet Take 1 tablet by mouth daily. [employer] [facility] daily   Yes [provider]  simvastatin (ZOCOR) 40 MG tablet Take 40 mg by mouth daily.   Yes [provider]  vitamin B-12 (CYANOCOBALAMIN) 100 MCG tablet Take 100 mcg by mouth daily.   Yes  [provider]  ALPRAZolam Duanne Moron) 0.5 MG tablet Take by mouth daily as needed. Patient not taking: Reported on 09/18/2019 07/03/19   [provider]  loratadine (CLARITIN) 10 MG tablet Take 10 mg by mouth daily. Patient not taking: Reported on 08/26/2020    [provider]  [employer] VERIO test strip 1 each daily. 08/27/19   [provider]  ranitidine (ZANTAC) 150 MG capsule Take 150 mg by mouth daily. Patient not taking: Reported on 09/18/2019    [provider]  tamsulosin (FLOMAX) 0.4 MG CAPS capsule Take 1 capsule (0.4 mg total) by mouth daily. Patient not taking: Reported on 09/18/2019 10/11/18   Nena Polio, MD    Allergies as of 07/27/2020 - Review Complete 09/18/2019  Allergen Reaction Noted   Atenolol Hives 07/22/2007   Shellfish allergy Hives 11/24/2014    Family History  Problem Relation Age of Onset   Colon cancer Maternal Aunt    Liver disease Brother    Colon polyps Neg Hx    Esophageal cancer Neg Hx    Rectal cancer Neg Hx    Stomach cancer Neg Hx    Pancreatic cancer Neg Hx     Social History   Socioeconomic History   Marital status: Married  Spouse name: Not on file   Number of children: Not on file   Years of education: Not on file   Highest education level: Not on file  Occupational History   Not on file  Tobacco Use   Smoking status: Never   Smokeless tobacco: Never  Substance and Sexual Activity   Alcohol use: No    Alcohol/week: 0.0 standard drinks   Drug use: No   Sexual activity: Not on file  Other Topics Concern   Not on file  Social History Narrative   Not on file   Social Determinants of Health   Financial Resource Strain: Not on file  Food Insecurity: Not on file  Transportation Needs: Not on file  Physical Activity: Not on file  Stress: Not on file  Social Connections: Not on file  Intimate Partner Violence: Not on file    Review of Systems: See HPI, otherwise negative  ROS  Physical Exam: BP (!) 148/61   Pulse (!) 58   Temp 97.8 F (36.6 C)   Resp 16   Ht 5\' 4"  (1.626 m)   Wt 73.5 kg   SpO2 97%   BMI 27.81 kg/m  General:   Alert,  pleasant and cooperative in NAD Head:  Normocephalic and atraumatic. Lungs:  Clear to auscultation.    Heart:  Regular rate and rhythm.   Impression/Plan: Autumn Sharp is here for ophthalmic surgery.  Risks, benefits, limitations, and alternatives regarding ophthalmic surgery have been reviewed with the patient.  Questions have been answered.  All parties agreeable.   Leandrew Koyanagi, MD  09/08/2020, 10:09 AM

## 2020-09-08 NOTE — Anesthesia Postprocedure Evaluation (Signed)
Anesthesia Post Note  Patient: Autumn Sharp  Procedure(s) Performed: CATARACT EXTRACTION PHACO AND INTRAOCULAR LENS PLACEMENT (IOC) LEFT DIABETIC 10.18 01:27.3 (Left: Eye)     Patient location during evaluation: PACU Anesthesia Type: MAC Level of consciousness: awake and alert Pain management: pain level controlled Vital Signs Assessment: post-procedure vital signs reviewed and stable Respiratory status: spontaneous breathing, nonlabored ventilation, respiratory function stable and patient connected to nasal cannula oxygen Cardiovascular status: stable and blood pressure returned to baseline Postop Assessment: no apparent nausea or vomiting Anesthetic complications: no   No notable events documented.  Adele Barthel Arik Husmann

## 2020-09-08 NOTE — Transfer of Care (Signed)
Immediate Anesthesia Transfer of Care Note  Patient: Autumn Sharp  Procedure(s) Performed: CATARACT EXTRACTION PHACO AND INTRAOCULAR LENS PLACEMENT (IOC) LEFT DIABETIC 10.18 01:27.3 (Left: Eye)  Patient Location: PACU  Anesthesia Type: MAC  Level of Consciousness: awake, alert  and patient cooperative  Airway and Oxygen Therapy: Patient Spontanous Breathing and Patient connected to supplemental oxygen  Post-op Assessment: Post-op Vital signs reviewed, Patient's Cardiovascular Status Stable, Respiratory Function Stable, Patent Airway and No signs of Nausea or vomiting  Post-op Vital Signs: Reviewed and stable  Complications: No notable events documented.

## 2020-09-09 ENCOUNTER — Other Ambulatory Visit: Payer: Self-pay

## 2020-09-09 ENCOUNTER — Encounter: Payer: Self-pay | Admitting: Ophthalmology

## 2020-09-09 MED ORDER — SIGHTPATH DOSE#1 BSS IO SOLN
INTRAOCULAR | Status: DC | PRN
  Administered 2020-09-08: 15 mL via INTRAOCULAR

## 2020-09-13 DIAGNOSIS — H2511 Age-related nuclear cataract, right eye: Secondary | ICD-10-CM | POA: Diagnosis not present

## 2020-09-21 NOTE — Discharge Instructions (Signed)

## 2020-09-22 ENCOUNTER — Encounter: Payer: Self-pay | Admitting: Ophthalmology

## 2020-09-22 ENCOUNTER — Ambulatory Visit
Admission: RE | Admit: 2020-09-22 | Discharge: 2020-09-22 | Disposition: A | Discharge status: Home / Self Care | Payer: Medicare HMO | Source: Ambulatory Visit | Attending: Ophthalmology | Admitting: Ophthalmology

## 2020-09-22 ENCOUNTER — Encounter
Admission: RE | Disposition: A | Discharge status: Home / Self Care | Payer: Self-pay | Source: Ambulatory Visit | Attending: Ophthalmology

## 2020-09-22 ENCOUNTER — Other Ambulatory Visit: Payer: Self-pay

## 2020-09-22 ENCOUNTER — Ambulatory Visit: Payer: Medicare HMO | Admitting: Anesthesiology

## 2020-09-22 DIAGNOSIS — Z79899 Other long term (current) drug therapy: Secondary | ICD-10-CM | POA: Diagnosis not present

## 2020-09-22 DIAGNOSIS — Z9842 Cataract extraction status, left eye: Secondary | ICD-10-CM | POA: Insufficient documentation

## 2020-09-22 DIAGNOSIS — Z888 Allergy status to other drugs, medicaments and biological substances status: Secondary | ICD-10-CM | POA: Insufficient documentation

## 2020-09-22 DIAGNOSIS — Z7982 Long term (current) use of aspirin: Secondary | ICD-10-CM | POA: Diagnosis not present

## 2020-09-22 DIAGNOSIS — H2511 Age-related nuclear cataract, right eye: Secondary | ICD-10-CM | POA: Insufficient documentation

## 2020-09-22 DIAGNOSIS — H25811 Combined forms of age-related cataract, right eye: Secondary | ICD-10-CM | POA: Diagnosis not present

## 2020-09-22 DIAGNOSIS — Z91013 Allergy to seafood: Secondary | ICD-10-CM | POA: Insufficient documentation

## 2020-09-22 HISTORY — PX: CATARACT EXTRACTION W/PHACO: SHX586

## 2020-09-22 LAB — GLUCOSE, CAPILLARY: Glucose-Capillary: 123 mg/dL — ABNORMAL HIGH (ref 70–99)

## 2020-09-22 SURGERY — PHACOEMULSIFICATION, CATARACT, WITH IOL INSERTION
Anesthesia: Monitor Anesthesia Care | Site: Eye | Laterality: Right

## 2020-09-22 MED ORDER — SIGHTPATH DOSE#1 NA HYALUR & NA CHOND-NA HYALUR IO KIT
PACK | INTRAOCULAR | Status: DC | PRN
  Administered 2020-09-22: 1 via OPHTHALMIC

## 2020-09-22 MED ORDER — CEFUROXIME OPHTHALMIC INJECTION 1 MG/0.1 ML
INJECTION | OPHTHALMIC | Status: DC | PRN
  Administered 2020-09-22: 0.1 mL via OPHTHALMIC

## 2020-09-22 MED ORDER — BRIMONIDINE TARTRATE-TIMOLOL 0.2-0.5 % OP SOLN
OPHTHALMIC | Status: DC | PRN
  Administered 2020-09-22: 1 [drp] via OPHTHALMIC

## 2020-09-22 MED ORDER — TETRACAINE HCL 0.5 % OP SOLN
1.0000 [drp] | OPHTHALMIC | Status: DC | PRN
  Administered 2020-09-22 (×3): 1 [drp] via OPHTHALMIC

## 2020-09-22 MED ORDER — CYCLOPENTOLATE HCL 2 % OP SOLN
1.0000 [drp] | OPHTHALMIC | Status: AC
  Administered 2020-09-22 (×3): 1 [drp] via OPHTHALMIC

## 2020-09-22 MED ORDER — SIGHTPATH DOSE#1 BSS IO SOLN
INTRAOCULAR | Status: DC | PRN
  Administered 2020-09-22: 15 mL via INTRAOCULAR

## 2020-09-22 MED ORDER — SIGHTPATH DOSE#1 BSS IO SOLN
INTRAOCULAR | Status: DC | PRN
  Administered 2020-09-22: 77 mL via OPHTHALMIC

## 2020-09-22 MED ORDER — FENTANYL CITRATE (PF) 100 MCG/2ML IJ SOLN
INTRAMUSCULAR | Status: DC | PRN
  Administered 2020-09-22 (×2): 50 ug via INTRAVENOUS

## 2020-09-22 MED ORDER — MIDAZOLAM HCL 2 MG/2ML IJ SOLN
INTRAMUSCULAR | Status: DC | PRN
  Administered 2020-09-22: 2 mg via INTRAVENOUS

## 2020-09-22 MED ORDER — PHENYLEPHRINE HCL 10 % OP SOLN
1.0000 [drp] | OPHTHALMIC | Status: AC
  Administered 2020-09-22 (×3): 1 [drp] via OPHTHALMIC

## 2020-09-22 MED ORDER — SIGHTPATH DOSE#1 BSS IO SOLN
INTRAOCULAR | Status: DC | PRN
  Administered 2020-09-22: 1 mL via INTRAMUSCULAR

## 2020-09-22 SURGICAL SUPPLY — 19 items
CANNULA ANT/CHMB 27G (MISCELLANEOUS) ×1 IMPLANT
CANNULA ANT/CHMB 27GA (MISCELLANEOUS) ×2 IMPLANT
GLOVE SURG ENC TEXT LTX SZ7.5 (GLOVE) ×2 IMPLANT
GLOVE SURG TRIUMPH 8.0 PF LTX (GLOVE) ×2 IMPLANT
GOWN STRL REUS W/ TWL LRG LVL3 (GOWN DISPOSABLE) ×2 IMPLANT
GOWN STRL REUS W/TWL LRG LVL3 (GOWN DISPOSABLE) ×4
LENS IOL DIOP 21.5 (Intraocular Lens) ×2 IMPLANT
LENS IOL TECNIS MONO 21.5 (Intraocular Lens) IMPLANT
MARKER SKIN DUAL TIP RULER LAB (MISCELLANEOUS) ×2 IMPLANT
NDL CAPSULORHEX 25GA (NEEDLE) ×1 IMPLANT
NDL FILTER BLUNT 18X1 1/2 (NEEDLE) ×2 IMPLANT
NEEDLE CAPSULORHEX 25GA (NEEDLE) ×2 IMPLANT
NEEDLE FILTER BLUNT 18X 1/2SAF (NEEDLE) ×2
NEEDLE FILTER BLUNT 18X1 1/2 (NEEDLE) ×2 IMPLANT
PACK EYE AFTER SURG (MISCELLANEOUS) ×2 IMPLANT
SYR 3ML LL SCALE MARK (SYRINGE) ×4 IMPLANT
SYR TB 1ML LUER SLIP (SYRINGE) ×2 IMPLANT
WATER STERILE IRR 250ML POUR (IV SOLUTION) ×2 IMPLANT
WIPE NON LINTING 3.25X3.25 (MISCELLANEOUS) ×2 IMPLANT

## 2020-09-22 NOTE — H&P (Signed)
[facility]   Primary Care Physician:  Kelton Pillar, MD Ophthalmologist: Dr. Leandrew Koyanagi  Pre-Procedure History & Physical: HPI:  Autumn Sharp is a 70 y.o. female here for ophthalmic surgery.   Past Medical History:  Diagnosis Date   Allergy    seasonal   Anxiety    Arthritis    ? in back    Cataract    forming    Chronic kidney disease    kidney stones 09-2018   Depression    GERD (gastroesophageal reflux disease)    Hyperlipidemia    Hypertension     Past Surgical History:  Procedure Laterality Date   CATARACT EXTRACTION W/PHACO Left 09/08/2020   Procedure: CATARACT EXTRACTION PHACO AND INTRAOCULAR LENS PLACEMENT (IOC) LEFT DIABETIC 10.18 01:27.3;  Surgeon: Leandrew Koyanagi, MD;  Location: [facility];  Service: Ophthalmology;  Laterality: Left;   COLONOSCOPY  04-29-2004   DB-normal   ESOPHAGEAL DILATION     HAMMER TOE SURGERY     bil little toes   KNEE ARTHROSCOPY     left knee   PARTIAL HYSTERECTOMY     POLYPECTOMY     SHOULDER ARTHROSCOPY     right   TRIGGER FINGER RELEASE     right hand   TUBAL LIGATION     UPPER GASTROINTESTINAL ENDOSCOPY      Prior to Admission medications   Medication Sig Start Date End Date Taking? Authorizing Provider  aspirin 81 MG tablet Take 81 mg by mouth daily.   Yes [provider]  BLACK ELDERBERRY PO Take by mouth. gummies- 1 am, 1 pm   Yes [provider]  calcium-vitamin D (OSCAL WITH D) 500-200 MG-UNIT tablet Take 1 tablet by mouth daily with breakfast.   Yes [provider]  FLUoxetine (PROZAC) 40 MG capsule Take 40 mg by mouth daily. 07/23/19  Yes [provider]  fluticasone (FLONASE) 50 MCG/ACT nasal spray Place 2 sprays into both nostrils daily. Reported on 04/05/2015   Yes [provider]  losartan-hydrochlorothiazide (HYZAAR) 100-12.5 MG tablet Take 1 tablet by mouth daily.   Yes [provider]  Multiple Vitamin (MULTIVITAMIN) tablet  Take 1 tablet by mouth daily. [employer] [facility] daily   Yes [provider]  simvastatin (ZOCOR) 40 MG tablet Take 40 mg by mouth daily.   Yes [provider]  vitamin B-12 (CYANOCOBALAMIN) 100 MCG tablet Take 100 mcg by mouth daily.   Yes [provider]  ALPRAZolam Duanne Moron) 0.5 MG tablet Take by mouth daily as needed. Patient not taking: Reported on 09/18/2019 07/03/19   [provider]  loratadine (CLARITIN) 10 MG tablet Take 10 mg by mouth daily. Patient not taking: Reported on 08/26/2020    [provider]  [facility] VERIO test strip 1 each daily. 08/27/19   [provider]  ranitidine (ZANTAC) 150 MG capsule Take 150 mg by mouth daily. Patient not taking: Reported on 09/18/2019    [provider]  tamsulosin (FLOMAX) 0.4 MG CAPS capsule Take 1 capsule (0.4 mg total) by mouth daily. Patient not taking: Reported on 09/18/2019 10/11/18   Nena Polio, MD    Allergies as of 07/27/2020 - Review Complete 09/18/2019  Allergen Reaction Noted   Atenolol Hives 07/22/2007   Shellfish allergy Hives 11/24/2014    Family History  Problem Relation Age of Onset   Colon cancer Maternal Aunt    Liver disease Brother    Colon polyps Neg Hx    Esophageal cancer  Neg Hx    Rectal cancer Neg Hx    Stomach cancer Neg Hx    Pancreatic cancer Neg Hx     Social History   Socioeconomic History   Marital status: Married    Spouse name: Not on file   Number of children: Not on file   Years of education: Not on file   Highest education level: Not on file  Occupational History   Not on file  Tobacco Use   Smoking status: Never   Smokeless tobacco: Never  Substance and Sexual Activity   Alcohol use: No    Alcohol/week: 0.0 standard drinks   Drug use: No   Sexual activity: Not on file  Other Topics Concern   Not on file  Social History Narrative   Not on file   Social Determinants of Health   Financial Resource Strain: Not on file   Food Insecurity: Not on file  Transportation Needs: Not on file  Physical Activity: Not on file  Stress: Not on file  Social Connections: Not on file  Intimate Partner Violence: Not on file    Review of Systems: See HPI, otherwise negative ROS  Physical Exam: BP (!) 100/54   Pulse (!) 56   Temp (!) 97.3 F (36.3 C) (Temporal)   Wt 71.7 kg   SpO2 97%   BMI 27.12 kg/m  General:   Alert,  pleasant and cooperative in NAD Head:  Normocephalic and atraumatic. Lungs:  Clear to auscultation.    Heart:  Regular rate and rhythm.   Impression/Plan: Autumn Sharp is here for ophthalmic surgery.  Risks, benefits, limitations, and alternatives regarding ophthalmic surgery have been reviewed with the patient.  Questions have been answered.  All parties agreeable.   Leandrew Koyanagi, MD  09/22/2020, 7:32 AM

## 2020-09-22 NOTE — Transfer of Care (Signed)
Immediate Anesthesia Transfer of Care Note  Patient: Autumn Sharp  Procedure(s) Performed: CATARACT EXTRACTION PHACO AND INTRAOCULAR LENS PLACEMENT (IOC) RIGHT DIABETIC 11.34 01:17.8 (Right: Eye)  Patient Location: PACU  Anesthesia Type: MAC  Level of Consciousness: awake, alert  and patient cooperative  Airway and Oxygen Therapy: Patient Spontanous Breathing and Patient connected to supplemental oxygen  Post-op Assessment: Post-op Vital signs reviewed, Patient's Cardiovascular Status Stable, Respiratory Function Stable, Patent Airway and No signs of Nausea or vomiting  Post-op Vital Signs: Reviewed and stable  Complications: No notable events documented.

## 2020-09-22 NOTE — Anesthesia Preprocedure Evaluation (Signed)
Anesthesia Evaluation  Patient identified by MRN, date of birth, ID band Patient awake    History of Anesthesia Complications Negative for: history of anesthetic complications  Airway Mallampati: II  TM Distance: >3 FB Neck ROM: Full    Dental no notable dental hx.    Pulmonary neg pulmonary ROS,    Pulmonary exam normal        Cardiovascular hypertension, Normal cardiovascular exam     Neuro/Psych negative neurological ROS     GI/Hepatic negative GI ROS, Neg liver ROS,   Endo/Other  diabetes (pre-DM)  Renal/GU      Musculoskeletal   Abdominal   Peds  Hematology negative hematology ROS (+)   Anesthesia Other Findings   Reproductive/Obstetrics                             Anesthesia Physical  Anesthesia Plan  ASA: 2  Anesthesia Plan: MAC   Post-op Pain Management:    Induction: Intravenous  PONV Risk Score and Plan: 2 and TIVA, Midazolam and Treatment may vary due to age or medical condition  Airway Management Planned: Nasal Cannula and Natural Airway  Additional Equipment: None  Intra-op Plan:   Post-operative Plan:   Informed Consent: I have reviewed the patients History and Physical, chart, labs and discussed the procedure including the risks, benefits and alternatives for the proposed anesthesia with the patient or authorized representative who has indicated his/her understanding and acceptance.       Plan Discussed with: CRNA  Anesthesia Plan Comments:         Anesthesia Quick Evaluation

## 2020-09-22 NOTE — Anesthesia Postprocedure Evaluation (Signed)
Anesthesia Post Note  Patient: Autumn Sharp  Procedure(s) Performed: CATARACT EXTRACTION PHACO AND INTRAOCULAR LENS PLACEMENT (IOC) RIGHT DIABETIC 11.34 01:17.8 (Right: Eye)     Patient location during evaluation: PACU Anesthesia Type: MAC Level of consciousness: awake and alert Pain management: pain level controlled Vital Signs Assessment: post-procedure vital signs reviewed and stable Respiratory status: spontaneous breathing, nonlabored ventilation, respiratory function stable and patient connected to nasal cannula oxygen Cardiovascular status: stable and blood pressure returned to baseline Postop Assessment: no apparent nausea or vomiting Anesthetic complications: no   No notable events documented.  Sherly Brodbeck, Glade Stanford

## 2020-09-22 NOTE — Op Note (Signed)
LOCATION:  [place]   PREOPERATIVE DIAGNOSIS:    Nuclear sclerotic cataract right eye. H25.11   POSTOPERATIVE DIAGNOSIS:  Nuclear sclerotic cataract right eye.     PROCEDURE:  Phacoemusification with posterior chamber intraocular lens placement of the right eye   ULTRASOUND TIME: Procedure(s): CATARACT EXTRACTION PHACO AND INTRAOCULAR LENS PLACEMENT (IOC) RIGHT DIABETIC 11.34 01:17.8 (Right)  LENS:   Implant Name Type Inv. Item Serial No. Manufacturer Lot No. LRB No. Used Action  LENS IOL DIOP 21.5 - [id number] Intraocular Lens LENS IOL DIOP 21.5 [id number]   Right 1 Implanted         SURGEON:  Wyonia Hough, MD   ANESTHESIA:  Topical with tetracaine drops and 2% Xylocaine jelly, augmented with 1% preservative-free intracameral lidocaine.    COMPLICATIONS:  None.   DESCRIPTION OF PROCEDURE:  The patient was identified in the holding room and transported to the operating room and placed in the supine position under the operating microscope.  The right eye was identified as the operative eye and it was prepped and draped in the usual sterile ophthalmic fashion.   A 1 millimeter clear-corneal paracentesis was made at the 12:00 position.  0.5 ml of preservative-free 1% lidocaine was injected into the anterior chamber. The anterior chamber was filled with Viscoat viscoelastic.  A 2.4 millimeter keratome was used to make a near-clear corneal incision at the 9:00 position.  A curvilinear capsulorrhexis was made with a cystotome and capsulorrhexis forceps.  Balanced salt solution was used to hydrodissect and hydrodelineate the nucleus.   Phacoemulsification was then used in stop and chop fashion to remove the lens nucleus and epinucleus.  The remaining cortex was then removed using the irrigation and aspiration handpiece. Provisc was then placed into the capsular bag to distend it for lens placement.  A lens was then injected into the capsular bag.  The remaining  viscoelastic was aspirated.   Wounds were hydrated with balanced salt solution.  The anterior chamber was inflated to a physiologic pressure with balanced salt solution.  No wound leaks were noted. Cefuroxime 0.1 ml of a '10mg'$ /ml solution was injected into the anterior chamber for a dose of 1 mg of intracameral antibiotic at the completion of the case.   Timolol and Brimonidine drops were applied to the eye.  The patient was taken to the recovery room in stable condition without complications of anesthesia or surgery.   Nicloe Frontera 09/22/2020, 7:57 AM

## 2020-09-25 DIAGNOSIS — Z1231 Encounter for screening mammogram for malignant neoplasm of breast: Secondary | ICD-10-CM | POA: Diagnosis not present

## 2021-01-25 DIAGNOSIS — I10 Essential (primary) hypertension: Secondary | ICD-10-CM | POA: Diagnosis not present

## 2021-01-25 DIAGNOSIS — K219 Gastro-esophageal reflux disease without esophagitis: Secondary | ICD-10-CM | POA: Diagnosis not present

## 2021-01-25 DIAGNOSIS — I8390 Asymptomatic varicose veins of unspecified lower extremity: Secondary | ICD-10-CM | POA: Diagnosis not present

## 2021-01-25 DIAGNOSIS — E782 Mixed hyperlipidemia: Secondary | ICD-10-CM | POA: Diagnosis not present

## 2021-01-25 DIAGNOSIS — Z1389 Encounter for screening for other disorder: Secondary | ICD-10-CM | POA: Diagnosis not present

## 2021-01-25 DIAGNOSIS — Z0000 Encounter for general adult medical examination without abnormal findings: Secondary | ICD-10-CM | POA: Diagnosis not present

## 2021-01-25 DIAGNOSIS — R69 Illness, unspecified: Secondary | ICD-10-CM | POA: Diagnosis not present

## 2021-01-25 DIAGNOSIS — M8588 Other specified disorders of bone density and structure, other site: Secondary | ICD-10-CM | POA: Diagnosis not present

## 2021-01-25 DIAGNOSIS — F39 Unspecified mood [affective] disorder: Secondary | ICD-10-CM | POA: Diagnosis not present

## 2021-01-25 DIAGNOSIS — J309 Allergic rhinitis, unspecified: Secondary | ICD-10-CM | POA: Diagnosis not present

## 2021-01-25 DIAGNOSIS — R7303 Prediabetes: Secondary | ICD-10-CM | POA: Diagnosis not present

## 2021-04-03 IMAGING — CT CT ABDOMEN AND PELVIS WITH CONTRAST
2 of 5 series · 16 of 46 positions shown, 18 images · IV contrast (APPLIED)
Comparison: None.

CLINICAL DATA: RIGHT lower quadrant pain.  Nausea and vomiting

EXAM:
CT ABDOMEN AND PELVIS WITH CONTRAST
TECHNIQUE: Multidetector CT imaging of the abdomen and pelvis was performed
using the standard protocol following bolus administration of
intravenous contrast.
CONTRAST:  100mL OMNIPAQUE IOHEXOL 300 MG/ML  SOLN

[Series 2: axial st · axial · 0.69mm/px · z∈[-744,-324]mm · 13 of 96 slices shown, 15 images]
[im 6/96  soft-tissue]
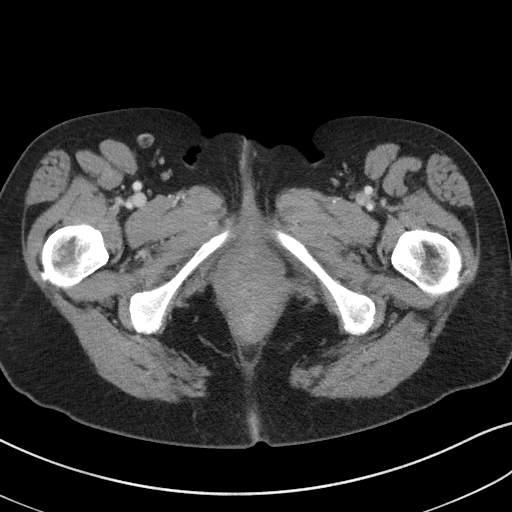
[im 6/96  bone]
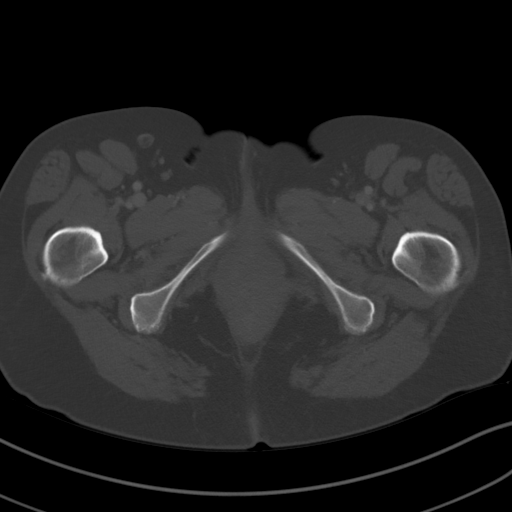
[im 12/96  soft-tissue]
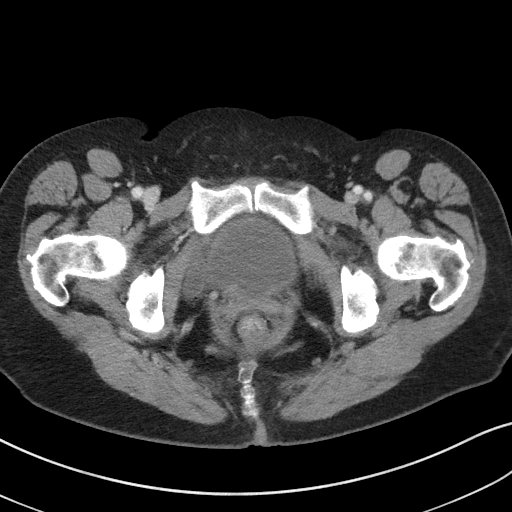
[im 23/96  soft-tissue]
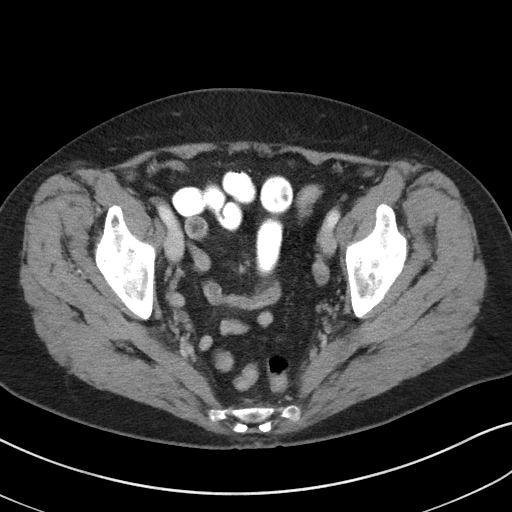
[im 28/96  soft-tissue]
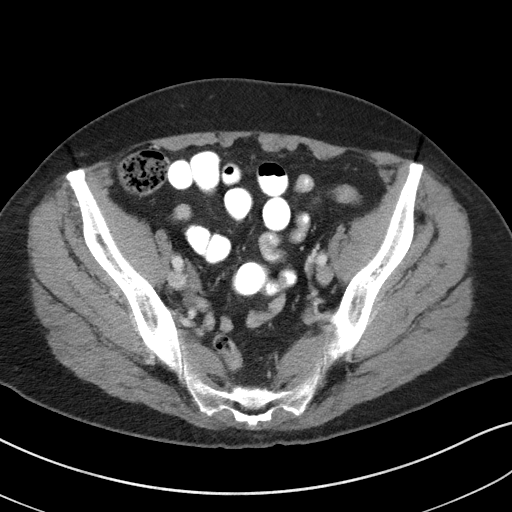
[im 34/96  soft-tissue]
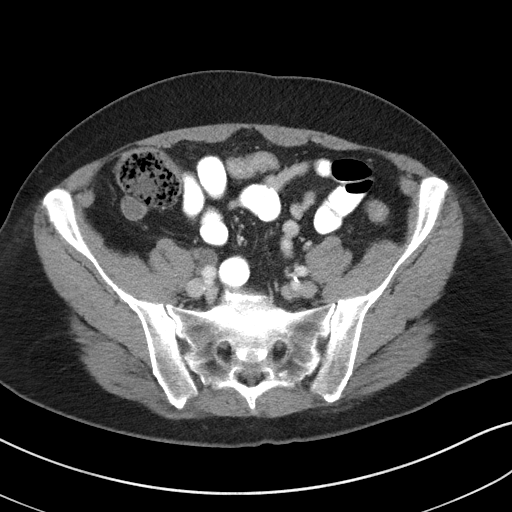
[im 40/96  soft-tissue]
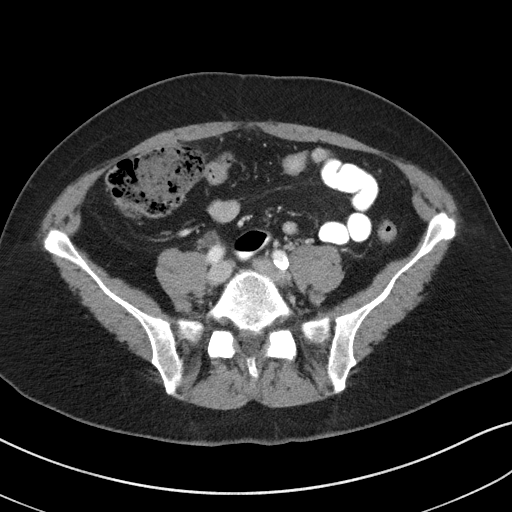
[im 51/96  soft-tissue]
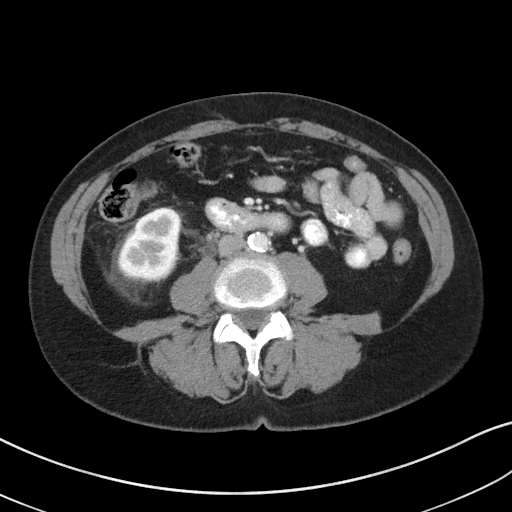
[im 56/96  soft-tissue]
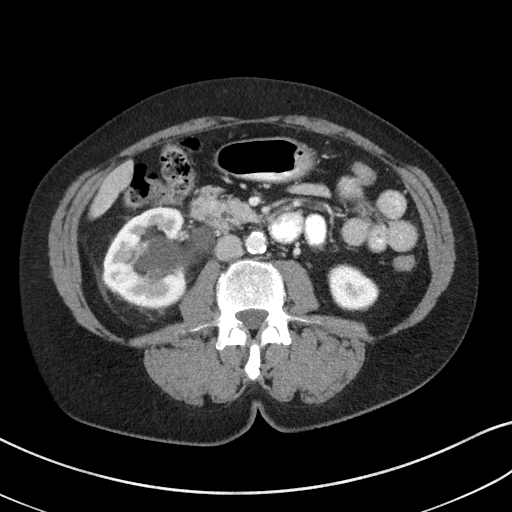
[im 62/96  soft-tissue]
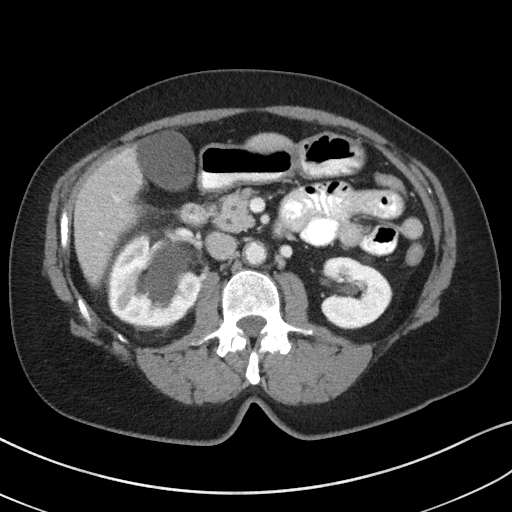
[im 62/96  bone]
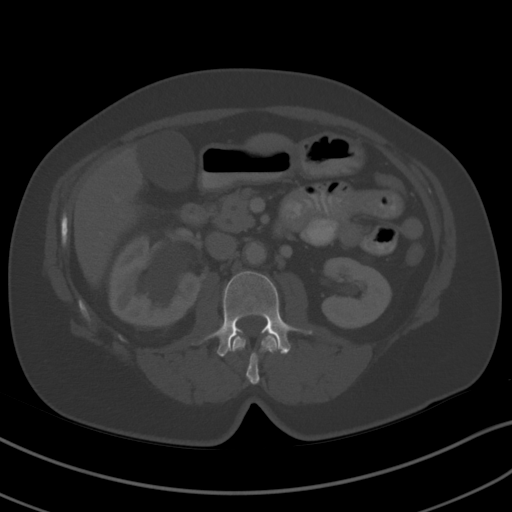
[im 68/96  soft-tissue]
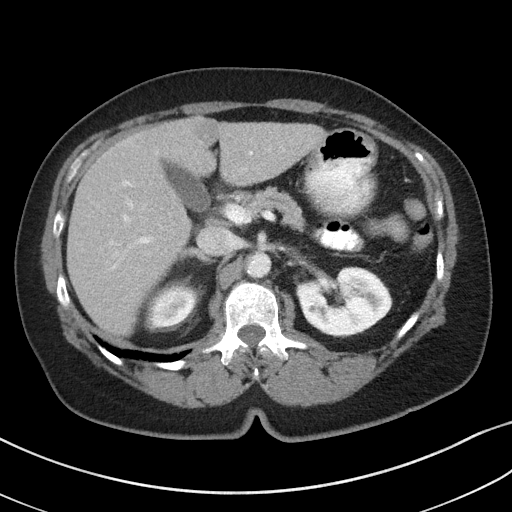
[im 73/96  soft-tissue]
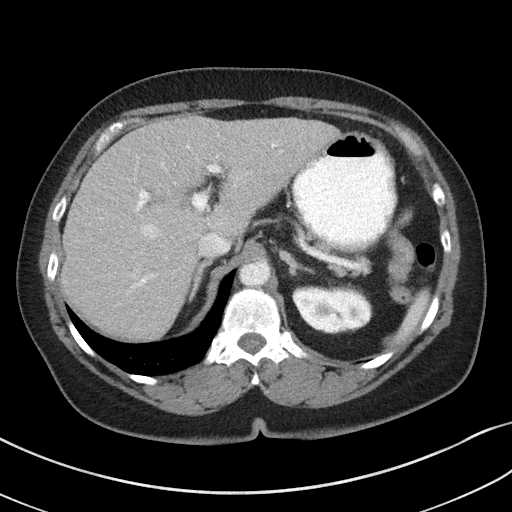
[im 84/96  soft-tissue]
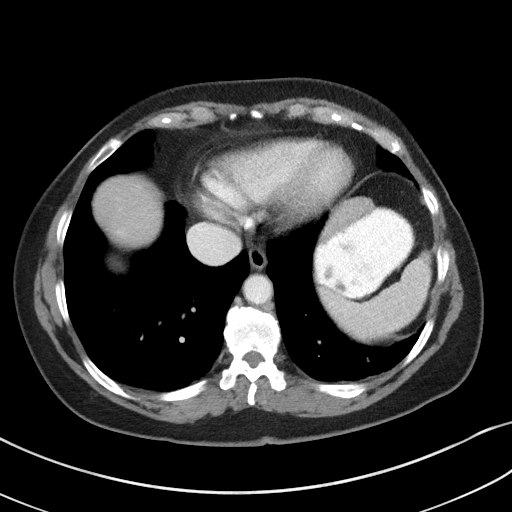
[im 90/96  soft-tissue]
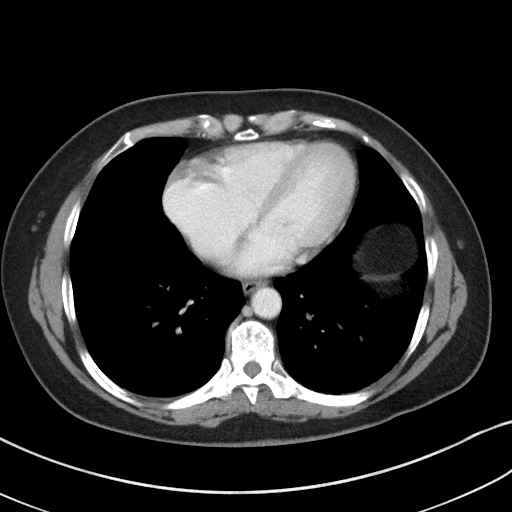

[Series 5: coronal st · coronal · 0.69mm/px · 3 of 86 slices shown]
[im 29/86  soft-tissue]
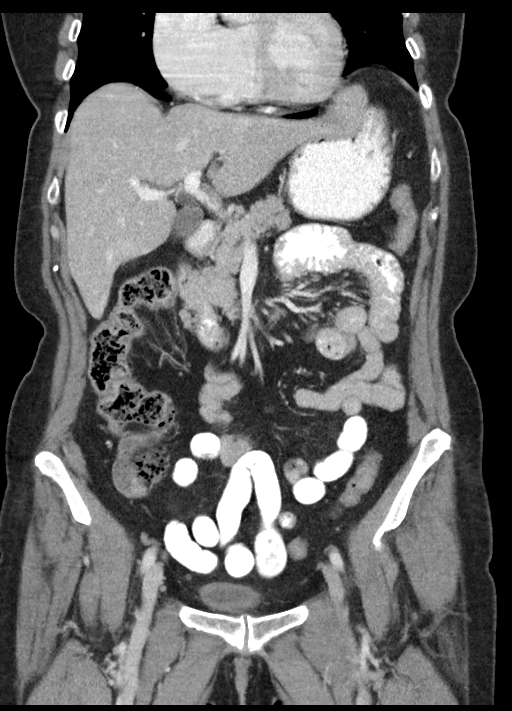
[im 38/86  soft-tissue]
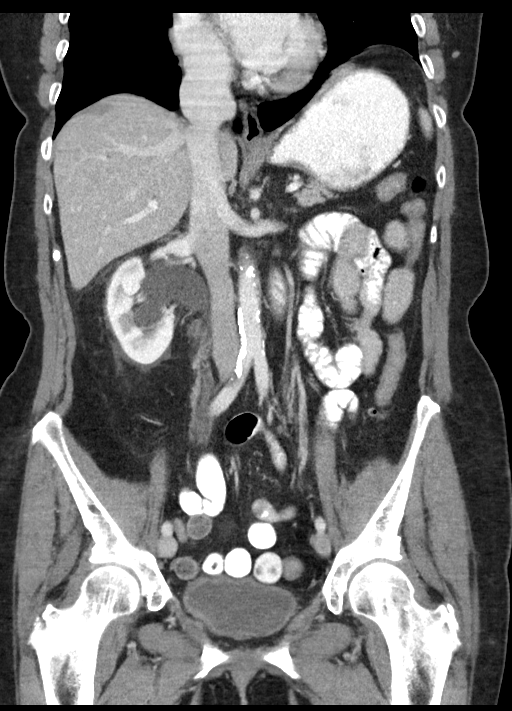
[im 48/86  soft-tissue]
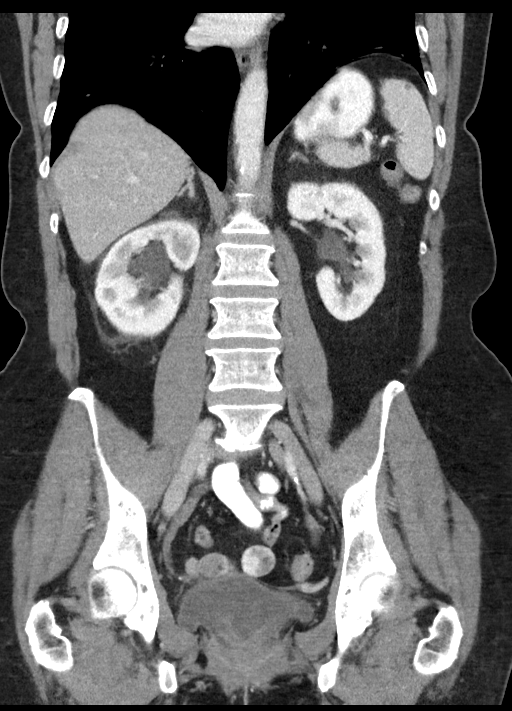

[16 of 46 positions shown; findings below may reference images not displayed]

FINDINGS: Lower chest: Lung bases are clear.

Hepatobiliary: No focal hepatic lesion. No biliary duct dilatation.
Gallbladder is normal. Common bile duct is normal.

Pancreas: Pancreas is normal. No ductal dilatation. No pancreatic
inflammation.

Spleen: Normal spleen

Adrenals/urinary tract: Adrenal glands are normal. Hydronephrosis
and hydroureter of the RIGHT collecting system secondary to
obstructing calculus in the distal RIGHT ureter measuring 4 mm on
image 81/2. This distal RIGHT ureteral calculus is approximately 1
cm from the vesicoureteral junction. There is inflammation of the
distal ureter associated obstructing stone.

No LEFT nephrolithiasis or ureterolithiasis. No bladder calculi.
Nonenhancing cyst exophytic from the LEFT kidney is small at 1 cm.

Stomach/Bowel: Stomach, small bowel, appendix, and cecum are normal.
The colon and rectosigmoid colon are normal.

Vascular/Lymphatic: Abdominal aorta is normal caliber. No periportal
or retroperitoneal adenopathy. No pelvic adenopathy.

Reproductive: Post hysterectomy

Other: No free fluid.

Musculoskeletal: No aggressive osseous lesion.
IMPRESSION: 1. High-grade obstruction of the RIGHT ureter with a calculus 1 cm
from the RIGHT vascular junction.
2. Minimal excretion of contrast in the collecting system on the
RIGHT on delayed imaging.

## 2021-07-20 DIAGNOSIS — L570 Actinic keratosis: Secondary | ICD-10-CM | POA: Diagnosis not present

## 2021-07-20 DIAGNOSIS — D225 Melanocytic nevi of trunk: Secondary | ICD-10-CM | POA: Diagnosis not present

## 2021-07-20 DIAGNOSIS — X32XXXA Exposure to sunlight, initial encounter: Secondary | ICD-10-CM | POA: Diagnosis not present

## 2021-07-20 DIAGNOSIS — Z1283 Encounter for screening for malignant neoplasm of skin: Secondary | ICD-10-CM | POA: Diagnosis not present

## 2021-07-26 DIAGNOSIS — R7303 Prediabetes: Secondary | ICD-10-CM | POA: Diagnosis not present

## 2021-07-26 DIAGNOSIS — R69 Illness, unspecified: Secondary | ICD-10-CM | POA: Diagnosis not present

## 2021-07-26 DIAGNOSIS — E782 Mixed hyperlipidemia: Secondary | ICD-10-CM | POA: Diagnosis not present

## 2021-07-26 DIAGNOSIS — I10 Essential (primary) hypertension: Secondary | ICD-10-CM | POA: Diagnosis not present

## 2021-07-26 DIAGNOSIS — R519 Headache, unspecified: Secondary | ICD-10-CM | POA: Diagnosis not present

## 2021-08-08 DIAGNOSIS — H43813 Vitreous degeneration, bilateral: Secondary | ICD-10-CM | POA: Diagnosis not present

## 2021-08-08 DIAGNOSIS — Z961 Presence of intraocular lens: Secondary | ICD-10-CM | POA: Diagnosis not present

## 2021-08-08 DIAGNOSIS — E119 Type 2 diabetes mellitus without complications: Secondary | ICD-10-CM | POA: Diagnosis not present

## 2021-10-19 DIAGNOSIS — M65322 Trigger finger, left index finger: Secondary | ICD-10-CM | POA: Diagnosis not present

## 2021-11-03 DIAGNOSIS — E1165 Type 2 diabetes mellitus with hyperglycemia: Secondary | ICD-10-CM | POA: Diagnosis not present

## 2021-11-03 DIAGNOSIS — E1169 Type 2 diabetes mellitus with other specified complication: Secondary | ICD-10-CM | POA: Diagnosis not present

## 2021-11-28 DIAGNOSIS — M654 Radial styloid tenosynovitis [de Quervain]: Secondary | ICD-10-CM | POA: Diagnosis not present

## 2021-11-28 DIAGNOSIS — M65321 Trigger finger, right index finger: Secondary | ICD-10-CM | POA: Diagnosis not present

## 2022-03-01 DIAGNOSIS — R1031 Right lower quadrant pain: Secondary | ICD-10-CM | POA: Diagnosis not present

## 2022-03-01 DIAGNOSIS — Z0000 Encounter for general adult medical examination without abnormal findings: Secondary | ICD-10-CM | POA: Diagnosis not present

## 2022-03-01 DIAGNOSIS — R002 Palpitations: Secondary | ICD-10-CM | POA: Diagnosis not present

## 2022-03-01 DIAGNOSIS — M5136 Other intervertebral disc degeneration, lumbar region: Secondary | ICD-10-CM | POA: Diagnosis not present

## 2022-03-01 DIAGNOSIS — R69 Illness, unspecified: Secondary | ICD-10-CM | POA: Diagnosis not present

## 2022-03-01 DIAGNOSIS — I8390 Asymptomatic varicose veins of unspecified lower extremity: Secondary | ICD-10-CM | POA: Diagnosis not present

## 2022-03-01 DIAGNOSIS — E782 Mixed hyperlipidemia: Secondary | ICD-10-CM | POA: Diagnosis not present

## 2022-03-01 DIAGNOSIS — E1169 Type 2 diabetes mellitus with other specified complication: Secondary | ICD-10-CM | POA: Diagnosis not present

## 2022-03-01 DIAGNOSIS — K219 Gastro-esophageal reflux disease without esophagitis: Secondary | ICD-10-CM | POA: Diagnosis not present

## 2022-03-01 DIAGNOSIS — K635 Polyp of colon: Secondary | ICD-10-CM | POA: Diagnosis not present

## 2022-03-01 DIAGNOSIS — J309 Allergic rhinitis, unspecified: Secondary | ICD-10-CM | POA: Diagnosis not present

## 2022-03-01 DIAGNOSIS — I10 Essential (primary) hypertension: Secondary | ICD-10-CM | POA: Diagnosis not present

## 2022-04-24 DIAGNOSIS — M65322 Trigger finger, left index finger: Secondary | ICD-10-CM | POA: Diagnosis not present

## 2022-04-24 DIAGNOSIS — M79641 Pain in right hand: Secondary | ICD-10-CM | POA: Diagnosis not present

## 2022-04-24 DIAGNOSIS — M654 Radial styloid tenosynovitis [de Quervain]: Secondary | ICD-10-CM | POA: Diagnosis not present

## 2022-07-06 DIAGNOSIS — M654 Radial styloid tenosynovitis [de Quervain]: Secondary | ICD-10-CM | POA: Diagnosis not present

## 2022-07-06 DIAGNOSIS — Z4789 Encounter for other orthopedic aftercare: Secondary | ICD-10-CM | POA: Diagnosis not present

## 2022-07-24 DIAGNOSIS — M25531 Pain in right wrist: Secondary | ICD-10-CM | POA: Diagnosis not present

## 2022-08-10 DIAGNOSIS — M7502 Adhesive capsulitis of left shoulder: Secondary | ICD-10-CM | POA: Diagnosis not present

## 2022-08-10 DIAGNOSIS — Z4789 Encounter for other orthopedic aftercare: Secondary | ICD-10-CM | POA: Diagnosis not present

## 2022-08-10 DIAGNOSIS — M654 Radial styloid tenosynovitis [de Quervain]: Secondary | ICD-10-CM | POA: Diagnosis not present

## 2022-08-22 ENCOUNTER — Encounter: Payer: Self-pay | Admitting: Gastroenterology

## 2022-08-25 DIAGNOSIS — M25612 Stiffness of left shoulder, not elsewhere classified: Secondary | ICD-10-CM | POA: Diagnosis not present

## 2022-08-25 DIAGNOSIS — M7502 Adhesive capsulitis of left shoulder: Secondary | ICD-10-CM | POA: Diagnosis not present

## 2022-08-30 ENCOUNTER — Other Ambulatory Visit: Payer: Self-pay | Admitting: Internal Medicine

## 2022-08-30 DIAGNOSIS — R1031 Right lower quadrant pain: Secondary | ICD-10-CM

## 2022-08-30 DIAGNOSIS — E1169 Type 2 diabetes mellitus with other specified complication: Secondary | ICD-10-CM | POA: Diagnosis not present

## 2022-08-30 DIAGNOSIS — J309 Allergic rhinitis, unspecified: Secondary | ICD-10-CM | POA: Diagnosis not present

## 2022-08-30 DIAGNOSIS — R053 Chronic cough: Secondary | ICD-10-CM | POA: Diagnosis not present

## 2022-08-30 DIAGNOSIS — E782 Mixed hyperlipidemia: Secondary | ICD-10-CM | POA: Diagnosis not present

## 2022-08-30 DIAGNOSIS — E119 Type 2 diabetes mellitus without complications: Secondary | ICD-10-CM | POA: Diagnosis not present

## 2022-08-31 ENCOUNTER — Ambulatory Visit
Admission: RE | Admit: 2022-08-31 | Discharge: 2022-08-31 | Disposition: A | Discharge status: Home / Self Care | Payer: Medicare HMO | Source: Ambulatory Visit | Attending: Internal Medicine | Admitting: Internal Medicine

## 2022-08-31 DIAGNOSIS — R1031 Right lower quadrant pain: Secondary | ICD-10-CM | POA: Diagnosis not present

## 2022-08-31 DIAGNOSIS — I700 Atherosclerosis of aorta: Secondary | ICD-10-CM | POA: Diagnosis not present

## 2022-08-31 DIAGNOSIS — K5730 Diverticulosis of large intestine without perforation or abscess without bleeding: Secondary | ICD-10-CM | POA: Diagnosis not present

## 2022-08-31 MED ORDER — IOPAMIDOL (ISOVUE-300) INJECTION 61%
100.0000 mL | Freq: Once | INTRAVENOUS | Status: AC | PRN
  Administered 2022-08-31: 100 mL via INTRAVENOUS

## 2022-09-06 DIAGNOSIS — M25612 Stiffness of left shoulder, not elsewhere classified: Secondary | ICD-10-CM | POA: Diagnosis not present

## 2022-09-06 DIAGNOSIS — M7502 Adhesive capsulitis of left shoulder: Secondary | ICD-10-CM | POA: Diagnosis not present

## 2022-09-11 DIAGNOSIS — M7502 Adhesive capsulitis of left shoulder: Secondary | ICD-10-CM | POA: Diagnosis not present

## 2022-09-11 DIAGNOSIS — M654 Radial styloid tenosynovitis [de Quervain]: Secondary | ICD-10-CM | POA: Diagnosis not present

## 2022-09-11 DIAGNOSIS — Z4789 Encounter for other orthopedic aftercare: Secondary | ICD-10-CM | POA: Diagnosis not present

## 2022-09-13 DIAGNOSIS — M7502 Adhesive capsulitis of left shoulder: Secondary | ICD-10-CM | POA: Diagnosis not present

## 2022-09-13 DIAGNOSIS — M25612 Stiffness of left shoulder, not elsewhere classified: Secondary | ICD-10-CM | POA: Diagnosis not present

## 2022-12-14 ENCOUNTER — Encounter: Payer: Self-pay | Admitting: Gastroenterology

## 2022-12-27 ENCOUNTER — Ambulatory Visit (AMBULATORY_SURGERY_CENTER): Payer: Medicare HMO | Admitting: *Deleted

## 2022-12-27 ENCOUNTER — Encounter: Payer: Self-pay | Admitting: Gastroenterology

## 2022-12-27 VITALS — Ht 64.0 in | Wt 147.0 lb

## 2022-12-27 DIAGNOSIS — N958 Other specified menopausal and perimenopausal disorders: Secondary | ICD-10-CM | POA: Diagnosis not present

## 2022-12-27 DIAGNOSIS — M8588 Other specified disorders of bone density and structure, other site: Secondary | ICD-10-CM | POA: Diagnosis not present

## 2022-12-27 DIAGNOSIS — Z860100 Personal history of colon polyps, unspecified: Secondary | ICD-10-CM

## 2022-12-27 MED ORDER — SUTAB 1479-225-188 MG PO TABS: 24.0000 | ORAL_TABLET | Freq: Once | ORAL | 0 refills | Status: AC

## 2022-12-27 NOTE — Progress Notes (Signed)
Pt's previsit is done over the phone and all paperwork (prep instructions) sent to patient.  Pt's name and DOB verified at the beginning of the previsit.  Pt denies any difficulty with ambulating.    No egg or soy allergy known to patient  No issues known to pt with past sedation with any surgeries or procedures Patient denies ever being told they had issues or difficulty with intubation  No FH of Malignant Hyperthermia Pt is not on diet pills Pt is not on  home 02  Pt is not on blood thinners  Pt denies issues with constipation  Pt is not on dialysis Pt denies any upcoming cardiac testing Pt encouraged to use to use Singlecare or Goodrx to reduce cost  Sutab coupon sent with paperwork  Patient's chart reviewed by Cathlyn Parsons CNRA prior to previsit and patient appropriate for the [facility].  Previsit completed and red dot placed by patient's name on their procedure day (on provider's schedule).

## 2023-01-09 ENCOUNTER — Encounter: Payer: Self-pay | Admitting: Gastroenterology

## 2023-01-09 ENCOUNTER — Ambulatory Visit: Payer: Medicare HMO | Admitting: Gastroenterology

## 2023-01-09 VITALS — BP 105/40 | HR 64 | Temp 97.8°F | Resp 20 | Ht 64.0 in | Wt 147.0 lb

## 2023-01-09 DIAGNOSIS — K635 Polyp of colon: Secondary | ICD-10-CM | POA: Diagnosis not present

## 2023-01-09 DIAGNOSIS — Z09 Encounter for follow-up examination after completed treatment for conditions other than malignant neoplasm: Secondary | ICD-10-CM | POA: Diagnosis not present

## 2023-01-09 DIAGNOSIS — F32A Depression, unspecified: Secondary | ICD-10-CM | POA: Diagnosis not present

## 2023-01-09 DIAGNOSIS — D122 Benign neoplasm of ascending colon: Secondary | ICD-10-CM | POA: Diagnosis not present

## 2023-01-09 DIAGNOSIS — E119 Type 2 diabetes mellitus without complications: Secondary | ICD-10-CM | POA: Diagnosis not present

## 2023-01-09 DIAGNOSIS — Z860100 Personal history of colon polyps, unspecified: Secondary | ICD-10-CM | POA: Diagnosis not present

## 2023-01-09 DIAGNOSIS — F419 Anxiety disorder, unspecified: Secondary | ICD-10-CM | POA: Diagnosis not present

## 2023-01-09 DIAGNOSIS — I10 Essential (primary) hypertension: Secondary | ICD-10-CM | POA: Diagnosis not present

## 2023-01-09 MED ORDER — SODIUM CHLORIDE 0.9 % IV SOLN: 500.0000 mL | Freq: Once | INTRAVENOUS | Status: DC

## 2023-01-09 NOTE — Op Note (Signed)
[facility] Patient Name: Autumn Sharp Procedure Date: 01/09/2023 2:38 PM MRN: [id number] Endoscopist: Viviann Spare P. Adela Lank , MD, [id number] Age: 72 Referring MD:  Date of Birth: Nov 17, 1950 Gender: Female Account #: [id number] Procedure:                Colonoscopy Indications:              High risk colon cancer surveillance: Personal                            history of colonic polyps - 2021 - 4 polyps, 2018 -                            3 polyps, 2017 - 10 polyps Medicines:                Monitored Anesthesia Care Procedure:                Pre-Anesthesia Assessment:                           - Prior to the procedure, a History and Physical                            was performed, and patient medications and                            allergies were reviewed. The patient's tolerance of                            previous anesthesia was also reviewed. The risks                            and benefits of the procedure and the sedation                            options and risks were discussed with the patient.                            All questions were answered, and informed consent                            was obtained. Prior Anticoagulants: The patient has                            taken no anticoagulant or antiplatelet agents. ASA                            Grade Assessment: III - A patient with severe                            systemic disease. After reviewing the risks and                            benefits, the patient was deemed in satisfactory  condition to undergo the procedure.                           After obtaining informed consent, the colonoscope                            was passed under direct vision. Throughout the                            procedure, the patient's blood pressure, pulse, and                            oxygen saturations were monitored continuously. The                            Olympus Scope SN:  [id number] was introduced through                            the anus and advanced to the the cecum, identified                            by appendiceal orifice and ileocecal valve. The                            colonoscopy was performed without difficulty. The                            patient tolerated the procedure well. The quality                            of the bowel preparation was adequate. The                            ileocecal valve, appendiceal orifice, and rectum                            were photographed. Scope In: 2:50:05 PM Scope Out: 3:05:59 PM Scope Withdrawal Time: 0 hours 10 minutes 12 seconds  Total Procedure Duration: 0 hours 15 minutes 54 seconds  Findings:                 The perianal and digital rectal examinations were                            normal.                           A 4 mm polyp was found in the ascending colon. The                            polyp was flat. The polyp was removed with a cold                            snare. Resection and retrieval were complete.  A single small angiodysplastic lesion was found in                            the cecum.                           A few small-mouthed diverticula were found in the                            sigmoid colon.                           Internal hemorrhoids were found during                            retroflexion. The hemorrhoids were small.                           The exam was otherwise without abnormality. Complications:            No immediate complications. Estimated blood loss:                            Minimal. Estimated Blood Loss:     Estimated blood loss was minimal. Impression:               - One 4 mm polyp in the ascending colon, removed                            with a cold snare. Resected and retrieved.                           - A single colonic angiodysplastic lesion.                           - Diverticulosis in the sigmoid colon.                            - Internal hemorrhoids.                           - The examination was otherwise normal. Recommendation:           - Patient has a contact number available for                            emergencies. The signs and symptoms of potential                            delayed complications were discussed with the                            patient. Return to normal activities tomorrow.                            Written discharge instructions were provided to the  patient.                           - Resume previous diet.                           - Continue present medications.                           - Await pathology results. Viviann Spare P. Eladia Frame, MD 01/09/2023 3:12:53 PM This report has been signed electronically.

## 2023-01-09 NOTE — Patient Instructions (Signed)
Handouts provided on polyps, diverticulosis and hemorrhoids.  Resume previous diet.  Continue present medications.  Await pathology results.   YOU HAD AN ENDOSCOPIC PROCEDURE TODAY AT THE [facility]:   Refer to the procedure report that was given to you for any specific questions about what was found during the examination.  If the procedure report does not answer your questions, please call your gastroenterologist to clarify.  If you requested that your care partner not be given the details of your procedure findings, then the procedure report has been included in a sealed envelope for you to review at your convenience later.  YOU SHOULD EXPECT: Some feelings of bloating in the abdomen. Passage of more gas than usual.  Walking can help get rid of the air that was put into your GI tract during the procedure and reduce the bloating. If you had a lower endoscopy (such as a colonoscopy or flexible sigmoidoscopy) you may notice spotting of blood in your stool or on the toilet paper. If you underwent a bowel prep for your procedure, you may not have a normal bowel movement for a few days.  Please Note:  You might notice some irritation and congestion in your nose or some drainage.  This is from the oxygen used during your procedure.  There is no need for concern and it should clear up in a day or so.  SYMPTOMS TO REPORT IMMEDIATELY:  Following lower endoscopy (colonoscopy or flexible sigmoidoscopy):  Excessive amounts of blood in the stool  Significant tenderness or worsening of abdominal pains  Swelling of the abdomen that is new, acute  Fever of 100F or higher   For urgent or emergent issues, a gastroenterologist can be reached at any hour by calling [phone]. Do not use MyChart messaging for urgent concerns.    DIET:  We do recommend a small meal at first, but then you may proceed to your regular diet.  Drink plenty of fluids but you should avoid alcoholic beverages for 24  hours.  ACTIVITY:  You should plan to take it easy for the rest of today and you should NOT DRIVE or use heavy machinery until tomorrow (because of the sedation medicines used during the test).    FOLLOW UP: Our staff will call the number listed on your records the next business day following your procedure.  We will call around 7:15- 8:00 am to check on you and address any questions or concerns that you may have regarding the information given to you following your procedure. If we do not reach you, we will leave a message.     If any biopsies were taken you will be contacted by phone or by letter within the next 1-3 weeks.  Please call us at [phone] if you have not heard about the biopsies in 3 weeks.    SIGNATURES/CONFIDENTIALITY: You and/or your care partner have signed paperwork which will be entered into your electronic medical record.  These signatures attest to the fact that that the information above on your After Visit Summary has been reviewed and is understood.  Full responsibility of the confidentiality of this discharge information lies with you and/or your care-partner.

## 2023-01-09 NOTE — Progress Notes (Signed)
[facility] History and Physical   Primary Care Physician:  Ollen Bowl, MD   Reason for Procedure:   History of colon polyps  Plan:    colonoscopy     HPI: Autumn Sharp is a 72 y.o. female  here for colonoscopy surveillance - history of colon polyps, 4 SSPs removed 2021, also had 10 adenomas removed 2017.    Patient denies any bowel symptoms at this time. No family history of colon cancer known. Otherwise feels well without any cardiopulmonary symptoms.   I have discussed risks / benefits of anesthesia and endoscopic procedure with Autumn Sharp and they wish to proceed with the exams as outlined today.    Past Medical History:  Diagnosis Date   Allergy    seasonal   Anxiety    Arthritis    ? in back    Cataract    forming    Chronic kidney disease    kidney stones 09-2018   Depression    Diabetes mellitus without complication (HCC)    GERD (gastroesophageal reflux disease)    Hyperlipidemia    Hypertension     Past Surgical History:  Procedure Laterality Date   CATARACT EXTRACTION W/PHACO Left 09/08/2020   Procedure: CATARACT EXTRACTION PHACO AND INTRAOCULAR LENS PLACEMENT (IOC) LEFT DIABETIC 10.18 01:27.3;  Surgeon: Lockie Mola, MD;  [facility];  Service: Ophthalmology;  Laterality: Left;   CATARACT EXTRACTION W/PHACO Right 09/22/2020   Procedure: CATARACT EXTRACTION PHACO AND INTRAOCULAR LENS PLACEMENT (IOC) RIGHT DIABETIC 11.34 01:17.8;  Surgeon: Lockie Mola, MD;  Location: [facility];  Service: Ophthalmology;  Laterality: Right;   COLONOSCOPY  04-29-2004   DB-normal   ESOPHAGEAL DILATION     HAMMER TOE SURGERY     bil little toes   KNEE ARTHROSCOPY     left knee   PARTIAL HYSTERECTOMY     POLYPECTOMY     SHOULDER ARTHROSCOPY     right   TRIGGER FINGER RELEASE     right hand   TUBAL LIGATION     UPPER GASTROINTESTINAL ENDOSCOPY      Prior to Admission medications   Medication Sig  Start Date End Date Taking? Authorizing Provider  aspirin 81 MG tablet Take 81 mg by mouth daily.   Yes [provider]  BLACK ELDERBERRY PO Take by mouth. gummies- 1 am, 1 pm   Yes [provider]  calcium-vitamin D (OSCAL WITH D) 500-200 MG-UNIT tablet Take 1 tablet by mouth daily with breakfast.   Yes [provider]  FLUoxetine (PROZAC) 40 MG capsule Take 40 mg by mouth daily. 07/23/19  Yes [provider]  fluticasone (FLONASE) 50 MCG/ACT nasal spray Place 2 sprays into both nostrils daily. Reported on 04/05/2015   Yes [provider]  JARDIANCE 10 MG TABS tablet Take 10 mg by mouth daily. 12/08/22  Yes [provider]  loratadine (CLARITIN) 10 MG tablet Take 10 mg by mouth daily.   Yes [provider]  losartan-hydrochlorothiazide (HYZAAR) 100-12.5 MG tablet Take 1 tablet by mouth daily.   Yes [provider]  Multiple Vitamin (MULTIVITAMIN) tablet Take 1 tablet by mouth daily. [facility] [facility] Womens daily   Yes [provider]  [facility] VERIO test strip 1 each daily. 08/27/19  Yes [provider]  simvastatin (ZOCOR) 40 MG tablet Take 40 mg by mouth daily.   Yes [provider]  vitamin B-12 (CYANOCOBALAMIN) 100 MCG tablet Take 100 mcg by mouth daily.  Yes [provider]  ALPRAZolam Prudy Feeler) 0.5 MG tablet Take by mouth daily as needed. 07/03/19   [provider]  ranitidine (ZANTAC) 150 MG capsule Take 150 mg by mouth daily. Takes PRN Patient not taking: Reported on 01/09/2023    [provider]  SUTAB [id number] MG TABS SMARTSIG:21 Tablet(s) By Mouth Once Patient not taking: Reported on 01/09/2023 12/27/22   [provider]  tamsulosin (FLOMAX) 0.4 MG CAPS capsule Take 1 capsule (0.4 mg total) by mouth daily. Patient not taking: Reported on 09/18/2019 10/11/18   Arnaldo Natal, MD    Current Outpatient Medications  Medication Sig Dispense Refill   aspirin 81 MG  tablet Take 81 mg by mouth daily.     BLACK ELDERBERRY PO Take by mouth. gummies- 1 am, 1 pm     calcium-vitamin D (OSCAL WITH D) 500-200 MG-UNIT tablet Take 1 tablet by mouth daily with breakfast.     FLUoxetine (PROZAC) 40 MG capsule Take 40 mg by mouth daily.     fluticasone (FLONASE) 50 MCG/ACT nasal spray Place 2 sprays into both nostrils daily. Reported on 04/05/2015     JARDIANCE 10 MG TABS tablet Take 10 mg by mouth daily.     loratadine (CLARITIN) 10 MG tablet Take 10 mg by mouth daily.     losartan-hydrochlorothiazide (HYZAAR) 100-12.5 MG tablet Take 1 tablet by mouth daily.     Multiple Vitamin (MULTIVITAMIN) tablet Take 1 tablet by mouth daily. [facility] Womens daily     ONETOUCH VERIO test strip 1 each daily.     simvastatin (ZOCOR) 40 MG tablet Take 40 mg by mouth daily.     vitamin B-12 (CYANOCOBALAMIN) 100 MCG tablet Take 100 mcg by mouth daily.     ALPRAZolam (XANAX) 0.5 MG tablet Take by mouth daily as needed.     ranitidine (ZANTAC) 150 MG capsule Take 150 mg by mouth daily. Takes PRN (Patient not taking: Reported on 01/09/2023)     SUTAB [id number] MG TABS SMARTSIG:21 Tablet(s) By Mouth Once (Patient not taking: Reported on 01/09/2023)     tamsulosin (FLOMAX) 0.4 MG CAPS capsule Take 1 capsule (0.4 mg total) by mouth daily. (Patient not taking: Reported on 09/18/2019) 7 capsule 0   Current Facility-Administered Medications  Medication Dose Route Frequency Provider Last Rate Last Admin   0.9 %  sodium chloride infusion  500 mL Intravenous Once Kerianna Rawlinson, Willaim Rayas, MD        Allergies as of 01/09/2023 - Review Complete 01/09/2023  Allergen Reaction Noted   Atenolol Hives 07/22/2007   Shellfish allergy Hives 11/24/2014    Family History  Problem Relation Age of Onset   Colon cancer Maternal Aunt    Liver disease Brother    Colon polyps Neg Hx    Esophageal cancer Neg Hx    Rectal cancer Neg Hx    Stomach cancer Neg Hx    Pancreatic cancer Neg Hx     Social  History   Socioeconomic History   Marital status: Married    Spouse name: Not on file   Number of children: Not on file   Years of education: Not on file   Highest education level: Not on file  Occupational History   Not on file  Tobacco Use   Smoking status: Never   Smokeless tobacco: Never  Vaping Use   Vaping status: Never Used  Substance and Sexual Activity   Alcohol use: No    Alcohol/week: 0.0 standard drinks of alcohol  Drug use: No   Sexual activity: Not on file  Other Topics Concern   Not on file  Social History Narrative   Not on file   Social Determinants of Health   Financial Resource Strain: Not on file  Food Insecurity: Not on file  Transportation Needs: Not on file  Physical Activity: Not on file  Stress: Not on file  Social Connections: Not on file  Intimate Partner Violence: Not on file    Review of Systems: All other review of systems negative except as mentioned in the HPI.  Physical Exam: Vital signs BP (!) 138/52   Pulse 60   Temp 97.8 F (36.6 C) (Skin)   Ht 5\' 4"  (1.626 m)   Wt 147 lb (66.7 kg)   SpO2 95%   BMI 25.23 kg/m   General:   Alert,  Well-developed, pleasant and cooperative in NAD Lungs:  Clear throughout to auscultation.   Heart:  Regular rate and rhythm Abdomen:  Soft, nontender and nondistended.   Neuro/Psych:  Alert and cooperative. Normal mood and affect. A and O x 3  Harlin Rain, MD [facility]

## 2023-01-09 NOTE — Progress Notes (Signed)
Called to room to assist during endoscopic procedure.  Patient ID and intended procedure confirmed with present staff. Received instructions for my participation in the procedure from the performing physician.  

## 2023-01-09 NOTE — Progress Notes (Signed)
Vss nad trans to pacu 

## 2023-01-09 NOTE — Progress Notes (Signed)
Pt's states no medical or surgical changes since previsit or office visit. 

## 2023-01-10 ENCOUNTER — Telehealth: Payer: Self-pay

## 2023-01-10 NOTE — Telephone Encounter (Signed)
  Follow up Call-     01/09/2023    2:21 PM  Call back number  Post procedure Call Back phone  # [phone]  Permission to leave phone message Yes     Left message

## 2023-01-12 LAB — SURGICAL PATHOLOGY

## 2023-03-28 DIAGNOSIS — I10 Essential (primary) hypertension: Secondary | ICD-10-CM | POA: Diagnosis not present

## 2023-03-28 DIAGNOSIS — E1169 Type 2 diabetes mellitus with other specified complication: Secondary | ICD-10-CM | POA: Diagnosis not present

## 2023-03-28 DIAGNOSIS — M26609 Unspecified temporomandibular joint disorder, unspecified side: Secondary | ICD-10-CM | POA: Diagnosis not present

## 2023-05-08 DIAGNOSIS — I499 Cardiac arrhythmia, unspecified: Secondary | ICD-10-CM | POA: Diagnosis not present

## 2023-05-08 DIAGNOSIS — Z0000 Encounter for general adult medical examination without abnormal findings: Secondary | ICD-10-CM | POA: Diagnosis not present

## 2023-05-08 DIAGNOSIS — E782 Mixed hyperlipidemia: Secondary | ICD-10-CM | POA: Diagnosis not present

## 2023-05-08 DIAGNOSIS — E1169 Type 2 diabetes mellitus with other specified complication: Secondary | ICD-10-CM | POA: Diagnosis not present

## 2023-05-08 DIAGNOSIS — M8588 Other specified disorders of bone density and structure, other site: Secondary | ICD-10-CM | POA: Diagnosis not present

## 2023-05-08 DIAGNOSIS — F39 Unspecified mood [affective] disorder: Secondary | ICD-10-CM | POA: Diagnosis not present

## 2023-05-08 DIAGNOSIS — I10 Essential (primary) hypertension: Secondary | ICD-10-CM | POA: Diagnosis not present

## 2023-06-06 DIAGNOSIS — R9431 Abnormal electrocardiogram [ECG] [EKG]: Secondary | ICD-10-CM | POA: Diagnosis not present

## 2023-06-20 DIAGNOSIS — K080 Exfoliation of teeth due to systemic causes: Secondary | ICD-10-CM | POA: Diagnosis not present

## 2023-09-12 DIAGNOSIS — E782 Mixed hyperlipidemia: Secondary | ICD-10-CM | POA: Diagnosis not present

## 2023-09-12 DIAGNOSIS — E1165 Type 2 diabetes mellitus with hyperglycemia: Secondary | ICD-10-CM | POA: Diagnosis not present

## 2023-09-12 DIAGNOSIS — M545 Low back pain, unspecified: Secondary | ICD-10-CM | POA: Diagnosis not present

## 2023-09-12 DIAGNOSIS — I10 Essential (primary) hypertension: Secondary | ICD-10-CM | POA: Diagnosis not present

## 2023-10-02 DIAGNOSIS — K080 Exfoliation of teeth due to systemic causes: Secondary | ICD-10-CM | POA: Diagnosis not present

## 2023-11-05 DIAGNOSIS — E1169 Type 2 diabetes mellitus with other specified complication: Secondary | ICD-10-CM | POA: Diagnosis not present

## 2023-11-05 DIAGNOSIS — M8588 Other specified disorders of bone density and structure, other site: Secondary | ICD-10-CM | POA: Diagnosis not present

## 2023-11-05 DIAGNOSIS — F39 Unspecified mood [affective] disorder: Secondary | ICD-10-CM | POA: Diagnosis not present

## 2023-11-05 DIAGNOSIS — J309 Allergic rhinitis, unspecified: Secondary | ICD-10-CM | POA: Diagnosis not present

## 2023-11-05 DIAGNOSIS — I10 Essential (primary) hypertension: Secondary | ICD-10-CM | POA: Diagnosis not present

## 2023-12-03 DIAGNOSIS — I10 Essential (primary) hypertension: Secondary | ICD-10-CM | POA: Diagnosis not present
